# Patient Record
Sex: Female | Born: 1940 | Race: White | Hispanic: No | Marital: Married | Smoking: Former smoker
Health system: Southern US, Community
[De-identification: ages and names within clinical notes are randomized; demographics above are authoritative.]

## PROBLEM LIST (undated history)

## (undated) DIAGNOSIS — R112 Nausea with vomiting, unspecified: Secondary | ICD-10-CM

## (undated) DIAGNOSIS — K219 Gastro-esophageal reflux disease without esophagitis: Secondary | ICD-10-CM

## (undated) DIAGNOSIS — Z8709 Personal history of other diseases of the respiratory system: Secondary | ICD-10-CM

## (undated) DIAGNOSIS — I10 Essential (primary) hypertension: Secondary | ICD-10-CM

## (undated) DIAGNOSIS — Z9889 Other specified postprocedural states: Secondary | ICD-10-CM

## (undated) DIAGNOSIS — I700 Atherosclerosis of aorta: Secondary | ICD-10-CM

## (undated) DIAGNOSIS — S0300XA Dislocation of jaw, unspecified side, initial encounter: Secondary | ICD-10-CM

## (undated) DIAGNOSIS — Z8669 Personal history of other diseases of the nervous system and sense organs: Secondary | ICD-10-CM

## (undated) DIAGNOSIS — Z8719 Personal history of other diseases of the digestive system: Secondary | ICD-10-CM

## (undated) DIAGNOSIS — I774 Celiac artery compression syndrome: Secondary | ICD-10-CM

## (undated) DIAGNOSIS — Z889 Allergy status to unspecified drugs, medicaments and biological substances status: Secondary | ICD-10-CM

## (undated) DIAGNOSIS — E119 Type 2 diabetes mellitus without complications: Secondary | ICD-10-CM

## (undated) HISTORY — PX: APPENDECTOMY: SHX54

## (undated) HISTORY — PX: UPPER GI ENDOSCOPY: SHX6162

## (undated) HISTORY — PX: COLONOSCOPY: SHX174

## (undated) HISTORY — PX: TONSILLECTOMY: SUR1361

---

## 1984-06-16 HISTORY — PX: ABDOMINAL HYSTERECTOMY: SHX81

## 1998-11-06 ENCOUNTER — Ambulatory Visit (HOSPITAL_COMMUNITY): Admission: RE | Admit: 1998-11-06 | Discharge: 1998-11-06 | Payer: Self-pay | Admitting: *Deleted

## 2000-05-21 ENCOUNTER — Encounter (INDEPENDENT_AMBULATORY_CARE_PROVIDER_SITE_OTHER): Payer: Self-pay | Admitting: Specialist

## 2000-05-21 ENCOUNTER — Other Ambulatory Visit: Admission: RE | Admit: 2000-05-21 | Discharge: 2000-05-21 | Payer: Self-pay | Admitting: Otolaryngology

## 2002-01-17 ENCOUNTER — Ambulatory Visit (HOSPITAL_COMMUNITY): Admission: RE | Admit: 2002-01-17 | Discharge: 2002-01-17 | Payer: Self-pay | Admitting: Gastroenterology

## 2006-09-07 ENCOUNTER — Encounter: Admission: RE | Admit: 2006-09-07 | Discharge: 2006-12-06 | Payer: Self-pay | Admitting: Family Medicine

## 2007-06-17 HISTORY — PX: CHOLECYSTECTOMY: SHX55

## 2007-07-30 ENCOUNTER — Emergency Department (HOSPITAL_COMMUNITY): Admission: EM | Admit: 2007-07-30 | Discharge: 2007-07-30 | Payer: Self-pay | Admitting: Emergency Medicine

## 2007-11-04 ENCOUNTER — Ambulatory Visit (HOSPITAL_COMMUNITY): Admission: RE | Admit: 2007-11-04 | Discharge: 2007-11-05 | Payer: Self-pay | Admitting: General Surgery

## 2007-11-04 ENCOUNTER — Encounter (INDEPENDENT_AMBULATORY_CARE_PROVIDER_SITE_OTHER): Payer: Self-pay | Admitting: General Surgery

## 2010-10-29 NOTE — Op Note (Signed)
[name], [name]               ACCOUNT NO.:  [id number]   MEDICAL RECORD NO.:  [id number]          PATIENT TYPE:  OIB   LOCATION:  5118                         FACILITY:  [facility]   PHYSICIAN:  Cherylynn Ridges, M.D.    DATE OF BIRTH:  1940-06-26   DATE OF PROCEDURE:  DATE OF DISCHARGE:                               OPERATIVE REPORT   PREOPERATIVE DIAGNOSIS:  Symptomatic gallstones.   POSTOPERATIVE DIAGNOSIS:  Miliary gallstones, multiple small gallstones  with possible distal nonobstructing common bile duct stone.   PROCEDURE:  Laparoscopic cholecystectomy with cholangiogram.   SURGEON:  Cherylynn Ridges, MD   ASSISTANT:  Caleen Essex III, MD   ANESTHESIA:  General endotracheal.   ESTIMATED BLOOD LOSS:  Less than 50 mL.   COMPLICATIONS:  None.   CONDITION:  Stable.   FINDINGS:  Intraoperative cholangiogram demonstrated what appeared to be  a nonobstructing distal common bile duct stone.  The patient had  multiple small stones contained within the gallbladder, making it  slightly difficult to remove it from the fascia.   INDICATIONS FOR OPERATION:  The patient is a 70 year old with symptoms  of gallstones and an ultrasound demonstrating multiple numbers of  gallstones in the gallbladder.   OPERATION:  The patient was taken to the operating room, placed on table  in supine position.  After an adequate general endotracheal anesthetic  was administered, she was prepped and draped in usual sterile manner  exposing the periumbilical and right upper quadrant area.   A supraumbilical curvilinear incision was made using a #11 blade and  taken down to the midline fascia.  We grabbed the fascia with Kocher  clamp x2 and then made an incision between the Kocher clamps using a #15  blade.  This opened [place] up into the preperitoneal fat underneath the  fascia where the fascial edges were grabbed with Kocher clamps, we  tented up on as we bluntly dissected down into peritoneal cavity with  a  Kelly clamp.  Once we entered the peritoneal cavity, purse-string suture  of 0 Vicryl was passed around the fascial opening into the peritoneal  cavity.  This secured in a Hassan cannula which was passed into the  peritoneal cavity with minimal difficulty.  We insufflated carbon  dioxide gas into the peritoneal cavity once the Hassan cannula was then  secured in place.  We subsequently passed to right upper quadrant 5-mm  cannulas and a subxiphoid 12-mm cannula under direct vision.  Once all  cannulas were in place, the patient was placed in reverse Trendelenburg  position and the left-side was tilted down.   The gallbladder was tense, but was grabbed with a grasper and retracted  towards the anterior abdominal wall and right upper quadrant.  A second  grasper was passed onto the infundibulum.  This allowed [place] to dissect  out the peritoneum overlying the triangle of Calot and hepatoduodenal  triangle identifying both the cystic duct and cystic artery.  Two clips  were placed along the proximal distal side of the cystic artery and it  was transected.  The cystic duct  was clipped along the gallbladder side,  then a cholecystodochotomy made with laparoscopic scissors.  A Cook  catheter which had been passed through the anterior abdominal wall, was  passed into the cholecystodochotomy and we performed a cholangiogram  which showed flow into the duodenum, although somewhat delay with the  impression of a distal common bile duct nonobstructing stone.  There was  good proximal filling and no evidence of dilatation.   With these findings, the cholangiogram was completed, the clip was  removed, securing the cholangiogram of cholangiocatheter in place and  then the cystic duct clipped x3 and transected.  We then dissected out  the gallbladder from its bed although we did enter into the gallbladder  up near the dome, because of taking capsule, we had to use an EndoCatch  bag to remove it from  the supraumbilical site.  This trocar was also  opened up the gallbladder because of a large number of small stones  which were removed with a ring forceps.  We were able to remove the  gallbladder from the supraumbilical site without enlarging the fascia.  We reinserted the [facility] cannula, secured it in place, insufflated  abdomen, then inspected the gallbladder bed where there appeared to be a  vein within the bed itself which was bleeding somewhat and also  fluctuating with blood flow which we controlled with endoclip x2.  We  also packed it with Surgicel.  That appeared to control all bleeding.  There was no biliary stain and drainage.  We removed all cannulas and  aspirated fluid and gas from above the liver.   Once this was done, we closed the supraumbilical site using the purse-  string suture, which was then placed.  We then injected 0.25% Marcaine  with epinephrine at all sites.  We closed the supraumbilical fascia and  subxiphoid fascia using a running subcuticular stitch of 4-0 Vicryl.  Dermabond was then applied to all incisions including the lateral  cannula sites which were closed primarily with Dermabond, Steri-Strips,  and Tegaderm were then applied.  All counts were correct including  needles, sponges, and instruments.      Cherylynn Ridges, M.D.  Electronically Signed     JOW/MEDQ  D:  11/04/2007  T:  11/05/2007  Job:  [id number]   cc:   Caryn Bee L. Little, M.D.

## 2010-11-01 NOTE — Op Note (Signed)
   [name], [name]                    ACCOUNT NO.:  [id number]   MEDICAL RECORD NO.:  [id number]                   PATIENT TYPE:  AMB   LOCATION:  ENDO                                 FACILITY:  [facility]   PHYSICIAN:  James L. Malon Kindle., M.D.          DATE OF BIRTH:  November 25, 1940   DATE OF PROCEDURE:  01/17/2002  DATE OF DISCHARGE:                                 OPERATIVE REPORT   PROCEDURE:  Colonoscopy.   MEDICATIONS:  Fentanyl 100 mg, Versed 10 mg IV.   SCOPE:  Adult Olympus adult video colonoscope.   INDICATION:  Marked family history of chronic neoplasia with brother having  multiple polyps.   DESCRIPTION OF PROCEDURE:  The procedure had been explained to the patient  and consent obtained.  With the patient in the left lateral decubitus  position, the Olympus adult video colonoscope was used since no pediatric  scope was available, and the prep was excellent.  Using abdominal pressure  and multiple position changes, we were able to reach the cecum.  The  ileocecal valve and appendiceal orifice were seen.  The scope was withdrawn.  The cecum, ascending colon, hepatic flexure, transverse colon, splenic  flexure, descending, and sigmoid colon were seen well upon removal.  No  polyps seen.  The patient tolerated the procedure well, maintained on low-  flow oxygen and pulse oximeter throughout the procedure.   ASSESSMENT:  No evidence of colon polyps.   PLAN:  Yearly Hemoccults are recommended and repeating in five years.                                               James L. Malon Kindle., M.D.    Waldron Session  D:  01/17/2002  T:  01/21/2002  Job:  [id number]   cc:   Caryn Bee L. Little, M.D.

## 2011-03-07 LAB — COMPREHENSIVE METABOLIC PANEL WITH GFR
ALT: 46 — ABNORMAL HIGH
AST: 28
Albumin: 4.1
Alkaline Phosphatase: 101
BUN: 11
CO2: 24
Calcium: 9
Chloride: 109
Creatinine, Ser: 0.75
GFR calc non Af Amer: >60
Glucose, Bld: 149 — ABNORMAL HIGH
Potassium: 3.5
Sodium: 142
Total Bilirubin: 0.7
Total Protein: 6.4

## 2011-03-07 LAB — URINALYSIS, ROUTINE W REFLEX MICROSCOPIC
Bilirubin Urine: NEGATIVE
Glucose, UA: NEGATIVE
Hgb urine dipstick: NEGATIVE
Ketones, ur: NEGATIVE
Leukocytes, UA: NEGATIVE
Nitrite: POSITIVE — AB
Protein, ur: NEGATIVE
Specific Gravity, Urine: 1.025
Urobilinogen, UA: 1
pH: 5.5

## 2011-03-07 LAB — CBC
HCT: 36.1
Hemoglobin: 12.7
MCHC: 35.1
MCV: 91.2
Platelets: 111 — ABNORMAL LOW
RBC: 3.97
RDW: 13.4
WBC: 9.1

## 2011-03-07 LAB — LIPASE, BLOOD: Lipase: 28

## 2011-03-07 LAB — URINE CULTURE: Colony Count: >=100000

## 2011-03-07 LAB — DIFFERENTIAL
Basophils Absolute: 0.1
Eosinophils Relative: 1
Lymphocytes Relative: 13
Lymphs Abs: 1.2
Neutro Abs: 7.4
Neutrophils Relative %: 81 — ABNORMAL HIGH

## 2011-03-07 LAB — URINE MICROSCOPIC-ADD ON

## 2011-03-12 LAB — DIFFERENTIAL
Basophils Absolute: 0
Basophils Relative: 0
Eosinophils Relative: 3
Lymphocytes Relative: 32
Lymphocytes Relative: 33
Lymphs Abs: 2.1
Monocytes Absolute: 0.4
Monocytes Absolute: 0.4
Monocytes Relative: 6
Monocytes Relative: 7
Neutro Abs: 3.3
Neutro Abs: 4
Neutrophils Relative %: 59

## 2011-03-12 LAB — COMPREHENSIVE METABOLIC PANEL
AST: 28
Albumin: 3.5
Albumin: 4.3
Alkaline Phosphatase: 84
BUN: 7
Calcium: 9.1
Chloride: 104
Creatinine, Ser: 0.8
Creatinine, Ser: 0.81
GFR calc Af Amer: >60
Glucose, Bld: 125 — ABNORMAL HIGH
Total Bilirubin: 0.9
Total Protein: 5.6 — ABNORMAL LOW
Total Protein: 6.7

## 2011-03-12 LAB — CBC
HCT: 33.8 — ABNORMAL LOW
HCT: 37.4
Hemoglobin: 11.9 — ABNORMAL LOW
Hemoglobin: 12.9
MCV: 92.1
Platelets: 130 — ABNORMAL LOW
Platelets: 145 — ABNORMAL LOW
RBC: 4.1
RDW: 12.8

## 2011-03-17 ENCOUNTER — Ambulatory Visit: Payer: Medicare Other | Attending: Orthopedic Surgery | Admitting: Physical Therapy

## 2011-03-17 DIAGNOSIS — M25579 Pain in unspecified ankle and joints of unspecified foot: Secondary | ICD-10-CM | POA: Insufficient documentation

## 2011-03-17 DIAGNOSIS — M25673 Stiffness of unspecified ankle, not elsewhere classified: Secondary | ICD-10-CM | POA: Insufficient documentation

## 2011-03-17 DIAGNOSIS — IMO0001 Reserved for inherently not codable concepts without codable children: Secondary | ICD-10-CM | POA: Insufficient documentation

## 2011-03-17 DIAGNOSIS — M25676 Stiffness of unspecified foot, not elsewhere classified: Secondary | ICD-10-CM | POA: Insufficient documentation

## 2011-03-24 ENCOUNTER — Ambulatory Visit: Payer: Medicare Other

## 2011-03-31 ENCOUNTER — Ambulatory Visit: Payer: Medicare Other | Admitting: Physical Therapy

## 2011-04-02 ENCOUNTER — Encounter: Payer: Medicare Other | Admitting: Physical Therapy

## 2011-04-07 ENCOUNTER — Ambulatory Visit: Payer: Medicare Other | Admitting: Physical Therapy

## 2011-04-09 ENCOUNTER — Encounter: Payer: Medicare Other | Admitting: Physical Therapy

## 2011-04-14 ENCOUNTER — Encounter: Payer: Medicare Other | Admitting: Physical Therapy

## 2011-04-16 ENCOUNTER — Encounter: Payer: Medicare Other | Admitting: Physical Therapy

## 2011-04-21 ENCOUNTER — Other Ambulatory Visit: Payer: Self-pay | Admitting: Oral Surgery

## 2011-04-21 ENCOUNTER — Encounter: Payer: Medicare Other | Admitting: Physical Therapy

## 2011-04-21 DIAGNOSIS — R52 Pain, unspecified: Secondary | ICD-10-CM

## 2011-04-23 ENCOUNTER — Encounter: Payer: Medicare Other | Admitting: Physical Therapy

## 2011-04-25 ENCOUNTER — Ambulatory Visit
Admission: RE | Admit: 2011-04-25 | Discharge: 2011-04-25 | Disposition: A | Discharge status: Home / Self Care | Payer: Medicare Other | Source: Ambulatory Visit | Attending: Oral Surgery | Admitting: Oral Surgery

## 2011-04-25 DIAGNOSIS — R52 Pain, unspecified: Secondary | ICD-10-CM

## 2011-04-30 ENCOUNTER — Other Ambulatory Visit: Payer: Medicare Other

## 2011-06-17 HISTORY — PX: TRANSFER / TRANSPLANT ANKLE TENDON SUPERFICIAL / DEEP: SUR1365

## 2011-08-14 ENCOUNTER — Ambulatory Visit: Payer: Medicare Other | Attending: Orthopedic Surgery | Admitting: Physical Therapy

## 2011-08-14 DIAGNOSIS — R262 Difficulty in walking, not elsewhere classified: Secondary | ICD-10-CM | POA: Insufficient documentation

## 2011-08-14 DIAGNOSIS — IMO0001 Reserved for inherently not codable concepts without codable children: Secondary | ICD-10-CM | POA: Insufficient documentation

## 2011-08-14 DIAGNOSIS — M25673 Stiffness of unspecified ankle, not elsewhere classified: Secondary | ICD-10-CM | POA: Insufficient documentation

## 2011-08-14 DIAGNOSIS — M6281 Muscle weakness (generalized): Secondary | ICD-10-CM | POA: Insufficient documentation

## 2011-08-14 DIAGNOSIS — R5381 Other malaise: Secondary | ICD-10-CM | POA: Insufficient documentation

## 2011-08-14 DIAGNOSIS — R609 Edema, unspecified: Secondary | ICD-10-CM | POA: Insufficient documentation

## 2011-08-14 DIAGNOSIS — M25676 Stiffness of unspecified foot, not elsewhere classified: Secondary | ICD-10-CM | POA: Insufficient documentation

## 2011-08-15 ENCOUNTER — Ambulatory Visit: Payer: Medicare Other | Attending: Orthopedic Surgery | Admitting: Physical Therapy

## 2011-08-15 DIAGNOSIS — R609 Edema, unspecified: Secondary | ICD-10-CM | POA: Insufficient documentation

## 2011-08-15 DIAGNOSIS — IMO0001 Reserved for inherently not codable concepts without codable children: Secondary | ICD-10-CM | POA: Insufficient documentation

## 2011-08-15 DIAGNOSIS — M6281 Muscle weakness (generalized): Secondary | ICD-10-CM | POA: Insufficient documentation

## 2011-08-15 DIAGNOSIS — M25673 Stiffness of unspecified ankle, not elsewhere classified: Secondary | ICD-10-CM | POA: Insufficient documentation

## 2011-08-15 DIAGNOSIS — M25676 Stiffness of unspecified foot, not elsewhere classified: Secondary | ICD-10-CM | POA: Insufficient documentation

## 2011-08-15 DIAGNOSIS — R262 Difficulty in walking, not elsewhere classified: Secondary | ICD-10-CM | POA: Insufficient documentation

## 2011-08-15 DIAGNOSIS — R5381 Other malaise: Secondary | ICD-10-CM | POA: Insufficient documentation

## 2011-08-18 ENCOUNTER — Ambulatory Visit: Payer: Medicare Other | Admitting: Physical Therapy

## 2011-08-20 ENCOUNTER — Ambulatory Visit: Payer: Medicare Other | Admitting: Physical Therapy

## 2011-08-22 ENCOUNTER — Ambulatory Visit: Payer: Medicare Other | Admitting: Physical Therapy

## 2011-08-25 ENCOUNTER — Ambulatory Visit: Payer: Medicare Other | Admitting: Physical Therapy

## 2011-08-27 ENCOUNTER — Ambulatory Visit: Payer: Medicare Other

## 2011-09-01 ENCOUNTER — Ambulatory Visit: Payer: Medicare Other | Admitting: Physical Therapy

## 2011-09-02 ENCOUNTER — Encounter: Payer: Medicare Other | Admitting: Physical Therapy

## 2011-09-05 ENCOUNTER — Ambulatory Visit: Payer: Medicare Other | Admitting: Physical Therapy

## 2011-09-08 ENCOUNTER — Ambulatory Visit: Payer: Medicare Other

## 2011-09-09 ENCOUNTER — Encounter: Payer: Medicare Other | Admitting: Physical Therapy

## 2011-09-10 ENCOUNTER — Ambulatory Visit: Payer: Medicare Other | Admitting: Physical Therapy

## 2011-09-11 ENCOUNTER — Encounter: Payer: Medicare Other | Admitting: Physical Therapy

## 2011-09-15 ENCOUNTER — Ambulatory Visit: Payer: Medicare Other | Attending: Orthopedic Surgery

## 2011-09-15 DIAGNOSIS — M6281 Muscle weakness (generalized): Secondary | ICD-10-CM | POA: Insufficient documentation

## 2011-09-15 DIAGNOSIS — R609 Edema, unspecified: Secondary | ICD-10-CM | POA: Insufficient documentation

## 2011-09-15 DIAGNOSIS — R5381 Other malaise: Secondary | ICD-10-CM | POA: Insufficient documentation

## 2011-09-15 DIAGNOSIS — M25673 Stiffness of unspecified ankle, not elsewhere classified: Secondary | ICD-10-CM | POA: Insufficient documentation

## 2011-09-15 DIAGNOSIS — IMO0001 Reserved for inherently not codable concepts without codable children: Secondary | ICD-10-CM | POA: Insufficient documentation

## 2011-09-15 DIAGNOSIS — R262 Difficulty in walking, not elsewhere classified: Secondary | ICD-10-CM | POA: Insufficient documentation

## 2011-09-15 DIAGNOSIS — M25676 Stiffness of unspecified foot, not elsewhere classified: Secondary | ICD-10-CM | POA: Insufficient documentation

## 2011-09-16 ENCOUNTER — Encounter: Payer: Medicare Other | Admitting: Physical Therapy

## 2011-09-17 ENCOUNTER — Ambulatory Visit: Payer: Medicare Other | Admitting: Physical Therapy

## 2011-09-18 ENCOUNTER — Encounter: Payer: Medicare Other | Admitting: Physical Therapy

## 2011-09-19 ENCOUNTER — Encounter: Payer: Medicare Other | Admitting: Physical Therapy

## 2011-09-22 ENCOUNTER — Ambulatory Visit: Payer: Medicare Other | Admitting: Physical Therapy

## 2011-09-24 ENCOUNTER — Encounter: Payer: Medicare Other | Admitting: Physical Therapy

## 2011-09-26 ENCOUNTER — Encounter: Payer: Medicare Other | Admitting: Physical Therapy

## 2011-09-26 ENCOUNTER — Ambulatory Visit: Payer: Medicare Other | Admitting: Physical Therapy

## 2011-09-29 ENCOUNTER — Ambulatory Visit: Payer: Medicare Other | Admitting: Physical Therapy

## 2011-10-01 ENCOUNTER — Encounter: Payer: Medicare Other | Admitting: Physical Therapy

## 2011-10-03 ENCOUNTER — Ambulatory Visit: Payer: Medicare Other | Admitting: Physical Therapy

## 2011-10-13 ENCOUNTER — Encounter (HOSPITAL_COMMUNITY): Payer: Self-pay

## 2011-10-13 ENCOUNTER — Encounter: Payer: Medicare Other | Admitting: Physical Therapy

## 2011-10-15 ENCOUNTER — Encounter: Payer: Medicare Other | Admitting: Physical Therapy

## 2011-10-17 ENCOUNTER — Encounter: Payer: Medicare Other | Admitting: Physical Therapy

## 2011-10-20 ENCOUNTER — Encounter (HOSPITAL_COMMUNITY)
Admission: RE | Admit: 2011-10-20 | Discharge: 2011-10-20 | Disposition: A | Discharge status: Home / Self Care | Payer: Medicare Other | Source: Ambulatory Visit | Attending: Oral Surgery | Admitting: Oral Surgery

## 2011-10-20 ENCOUNTER — Encounter (HOSPITAL_COMMUNITY): Payer: Self-pay

## 2011-10-20 HISTORY — DX: Allergy status to unspecified drugs, medicaments and biological substances: Z88.9

## 2011-10-20 HISTORY — DX: Dislocation of jaw, unspecified side, initial encounter: S03.00XA

## 2011-10-20 HISTORY — DX: Gastro-esophageal reflux disease without esophagitis: K21.9

## 2011-10-20 HISTORY — DX: Other specified postprocedural states: Z98.890

## 2011-10-20 HISTORY — DX: Other specified postprocedural states: R11.2

## 2011-10-20 LAB — SURGICAL PCR SCREEN
MRSA, PCR: NEGATIVE
Staphylococcus aureus: NEGATIVE

## 2011-10-20 LAB — CBC
HCT: 37.2 % (ref 36.0–46.0)
Hemoglobin: 12.9 g/dL (ref 12.0–15.0)
MCHC: 34.7 g/dL (ref 30.0–36.0)
RBC: 4.19 MIL/uL (ref 3.87–5.11)

## 2011-10-20 NOTE — Pre-Procedure Instructions (Signed)
20 [name]  10/20/2011   Your procedure is scheduled on:  Monday, May 13  Report to [facility] at 0530 AM.  Call this number if you have problems the morning of surgery: [phone]   Remember:   Do not eat food:After Midnight.  May have clear liquids: up to 4 Hours before arrival.  Clear liquids include soda, tea, black coffee, apple or grape juice, broth.  Take these medicines the morning of surgery with A SIP OF WATER: *Omeprazole**   Do not wear jewelry, make-up or nail polish.  Do not wear lotions, powders, or perfumes. You may wear deodorant.  Do not shave 48 hours prior to surgery.  Do not bring valuables to the hospital.  Contacts, dentures or bridgework may not be worn into surgery.  Leave suitcase in the car. After surgery it may be brought to your room.  For patients admitted to the hospital, checkout time is 11:00 AM the day of discharge.   Patients discharged the day of surgery will not be allowed to drive home.  Name and phone number of your driver: *n/a**  Special Instructions: CHG Shower Use Special Wash: 1/2 bottle night before surgery and 1/2 bottle morning of surgery.   Please read over the following fact sheets that you were given: Pain Booklet, Coughing and Deep Breathing, MRSA Information, Surgical Site Infection Prevention and Care and Recovery After Surgery

## 2011-10-27 ENCOUNTER — Ambulatory Visit (HOSPITAL_COMMUNITY)
Admission: RE | Admit: 2011-10-27 | Discharge: 2011-10-27 | Disposition: A | Discharge status: Home / Self Care | Payer: Medicare Other | Source: Ambulatory Visit | Attending: Oral Surgery | Admitting: Oral Surgery

## 2011-10-27 ENCOUNTER — Encounter (HOSPITAL_COMMUNITY): Payer: Self-pay | Admitting: *Deleted

## 2011-10-27 ENCOUNTER — Encounter (HOSPITAL_COMMUNITY)
Admission: RE | Disposition: A | Discharge status: Home / Self Care | Payer: Self-pay | Source: Ambulatory Visit | Attending: Oral Surgery

## 2011-10-27 ENCOUNTER — Encounter (HOSPITAL_COMMUNITY): Payer: Self-pay | Admitting: Anesthesiology

## 2011-10-27 ENCOUNTER — Ambulatory Visit (HOSPITAL_COMMUNITY): Payer: Medicare Other | Admitting: Anesthesiology

## 2011-10-27 DIAGNOSIS — K219 Gastro-esophageal reflux disease without esophagitis: Secondary | ICD-10-CM | POA: Insufficient documentation

## 2011-10-27 DIAGNOSIS — Z01812 Encounter for preprocedural laboratory examination: Secondary | ICD-10-CM | POA: Insufficient documentation

## 2011-10-27 DIAGNOSIS — M2669 Other specified disorders of temporomandibular joint: Secondary | ICD-10-CM | POA: Insufficient documentation

## 2011-10-27 DIAGNOSIS — M26659 Arthropathy of unspecified temporomandibular joint: Secondary | ICD-10-CM

## 2011-10-27 HISTORY — PX: TMJ ARTHROPLASTY: SHX1066

## 2011-10-27 SURGERY — ARTHROPLASTY, TMJ
Anesthesia: General | Site: Face | Laterality: Right | Wound class: Clean

## 2011-10-27 MED ORDER — BACITRACIN ZINC 500 UNIT/GM EX OINT
TOPICAL_OINTMENT | CUTANEOUS | Status: DC | PRN
  Administered 2011-10-27: 1 via TOPICAL

## 2011-10-27 MED ORDER — MIDAZOLAM HCL 5 MG/5ML IJ SOLN
INTRAMUSCULAR | Status: DC | PRN
  Administered 2011-10-27: 1 mg via INTRAVENOUS

## 2011-10-27 MED ORDER — SODIUM CHLORIDE 0.9 % IR SOLN
Status: DC | PRN
  Administered 2011-10-27: 1

## 2011-10-27 MED ORDER — PHENYLEPHRINE HCL 10 MG/ML IJ SOLN
INTRAMUSCULAR | Status: DC | PRN
  Administered 2011-10-27 (×3): 80 ug via INTRAVENOUS
  Administered 2011-10-27: 40 ug via INTRAVENOUS
  Administered 2011-10-27: 80 ug via INTRAVENOUS

## 2011-10-27 MED ORDER — PROMETHAZINE HCL 25 MG/ML IJ SOLN: 6.2500 mg | INTRAMUSCULAR | Status: DC | PRN

## 2011-10-27 MED ORDER — LACTATED RINGERS IV SOLN: INTRAVENOUS | Status: DC

## 2011-10-27 MED ORDER — ONDANSETRON HCL 4 MG/2ML IJ SOLN
INTRAMUSCULAR | Status: DC | PRN
  Administered 2011-10-27: 4 mg via INTRAVENOUS

## 2011-10-27 MED ORDER — SUCCINYLCHOLINE CHLORIDE 20 MG/ML IJ SOLN
INTRAMUSCULAR | Status: DC | PRN
  Administered 2011-10-27: 40 mg via INTRAVENOUS
  Administered 2011-10-27: 120 mg via INTRAVENOUS

## 2011-10-27 MED ORDER — LIDOCAINE HCL (CARDIAC) 20 MG/ML IV SOLN
INTRAVENOUS | Status: DC | PRN
  Administered 2011-10-27: 100 mg via INTRAVENOUS

## 2011-10-27 MED ORDER — LIDOCAINE-EPINEPHRINE 2 %-1:100000 IJ SOLN
INTRAMUSCULAR | Status: DC | PRN
  Administered 2011-10-27: 10 mL

## 2011-10-27 MED ORDER — FENTANYL CITRATE 0.05 MG/ML IJ SOLN
INTRAMUSCULAR | Status: DC | PRN
  Administered 2011-10-27 (×2): 50 ug via INTRAVENOUS

## 2011-10-27 MED ORDER — MEPERIDINE HCL 25 MG/ML IJ SOLN: 6.2500 mg | INTRAMUSCULAR | Status: DC | PRN

## 2011-10-27 MED ORDER — OXYMETAZOLINE HCL 0.05 % NA SOLN
NASAL | Status: DC | PRN
  Administered 2011-10-27: 1 via NASAL

## 2011-10-27 MED ORDER — PROPOFOL 10 MG/ML IV EMUL
INTRAVENOUS | Status: DC | PRN
  Administered 2011-10-27: 200 mg via INTRAVENOUS

## 2011-10-27 MED ORDER — CLINDAMYCIN PHOSPHATE 600 MG/4ML IJ SOLN
INTRAMUSCULAR | Status: AC
Start: 2011-10-27 — End: 2011-10-27
  Filled 2011-10-27: qty 4

## 2011-10-27 MED ORDER — SCOPOLAMINE 1 MG/3DAYS TD PT72
MEDICATED_PATCH | TRANSDERMAL | Status: DC | PRN
  Administered 2011-10-27: 1 via TRANSDERMAL

## 2011-10-27 MED ORDER — ONDANSETRON HCL 4 MG PO TABS: 4.0000 mg | ORAL_TABLET | Freq: Three times a day (TID) | ORAL | Status: AC | PRN

## 2011-10-27 MED ORDER — CLINDAMYCIN PHOSPHATE 600 MG/50ML IV SOLN
INTRAVENOUS | Status: DC | PRN
  Administered 2011-10-27: 600 mg via INTRAVENOUS

## 2011-10-27 MED ORDER — OXYCODONE-ACETAMINOPHEN 5-325 MG PO TABS: 1.0000 | ORAL_TABLET | ORAL | Status: AC | PRN

## 2011-10-27 MED ORDER — HYDROMORPHONE HCL PF 1 MG/ML IJ SOLN
0.2500 mg | INTRAMUSCULAR | Status: DC | PRN
  Administered 2011-10-27: 0.5 mg via INTRAVENOUS

## 2011-10-27 MED ORDER — DEXTROSE 5 % IV SOLN
INTRAVENOUS | Status: AC
  Filled 2011-10-27: qty 50

## 2011-10-27 MED ORDER — LACTATED RINGERS IV SOLN
INTRAVENOUS | Status: DC | PRN
  Administered 2011-10-27 (×2): via INTRAVENOUS

## 2011-10-27 MED ORDER — DEXAMETHASONE SODIUM PHOSPHATE 10 MG/ML IJ SOLN
INTRAMUSCULAR | Status: DC | PRN
  Administered 2011-10-27: 8 mg via INTRAVENOUS

## 2011-10-27 SURGICAL SUPPLY — 66 items
APPLICATOR COTTON TIP 6IN STRL (MISCELLANEOUS) ×1 IMPLANT
ATTRACTOMAT 16X20 MAGNETIC DRP (DRAPES) ×2 IMPLANT
BANDAGE ELASTIC 4 VELCRO ST LF (GAUZE/BANDAGES/DRESSINGS) ×1 IMPLANT
BANDAGE GAUZE ELAST BULKY 4 IN (GAUZE/BANDAGES/DRESSINGS) ×1 IMPLANT
BLADE MINI 60D BLUE (BLADE) ×1 IMPLANT
BLADE RECIPRO TAPERED (BLADE) IMPLANT
BLADE SCLERAL 3.0 60 BEV DOWN (BLADE) IMPLANT
BLADE SURG 15 STRL LF DISP TIS (BLADE) IMPLANT
BLADE SURG 15 STRL SS (BLADE) ×2
BLADE SURG CLIPPER 3M 9600 (MISCELLANEOUS) ×2 IMPLANT
BUR CROSS CUT (BURR)
BUR SABER TAPERED DIAMOND 2 (BURR) ×1 IMPLANT
BUR SRG MED 1.6XXCUT FSSR (BURR) IMPLANT
BUR SURG 4X8 MED (BURR) IMPLANT
BURR SRG MED 1.6XXCUT FSSR (BURR)
BURR SURG 4X8 MED (BURR)
CANISTER SUCTION 2500CC (MISCELLANEOUS) ×2 IMPLANT
CLEANER TIP ELECTROSURG 2X2 (MISCELLANEOUS) ×2 IMPLANT
CLOTH BEACON ORANGE TIMEOUT ST (SAFETY) ×2 IMPLANT
COVER SURGICAL LIGHT HANDLE (MISCELLANEOUS) ×2 IMPLANT
CRADLE DONUT ADULT HEAD (MISCELLANEOUS) ×2 IMPLANT
DECANTER SPIKE VIAL GLASS SM (MISCELLANEOUS) ×1 IMPLANT
DRAPE SURG 17X23 STRL (DRAPES) ×2 IMPLANT
DRESSING TELFA 8X3 (GAUZE/BANDAGES/DRESSINGS) ×1 IMPLANT
ELECT COATED BLADE 2.86 ST (ELECTRODE) ×1 IMPLANT
ELECT NDL TIP 2.8 STRL (NEEDLE) ×1 IMPLANT
ELECT NEEDLE TIP 2.8 STRL (NEEDLE) ×2 IMPLANT
GAUZE SPONGE 4X4 16PLY XRAY LF (GAUZE/BANDAGES/DRESSINGS) ×1 IMPLANT
GLOVE BIO SURGEON STRL SZ 6.5 (GLOVE) ×2 IMPLANT
GLOVE BIO SURGEON STRL SZ7 (GLOVE) ×1 IMPLANT
GLOVE BIO SURGEON STRL SZ7.5 (GLOVE) ×2 IMPLANT
GLOVE BIOGEL PI IND STRL 7.0 (GLOVE) ×1 IMPLANT
GLOVE BIOGEL PI INDICATOR 7.0 (GLOVE) ×2
GLOVE SURG SS PI 6.5 STRL IVOR (GLOVE) ×1 IMPLANT
GOWN STRL NON-REIN LRG LVL3 (GOWN DISPOSABLE) ×4 IMPLANT
GOWN STRL REIN XL XLG (GOWN DISPOSABLE) ×2 IMPLANT
KIT BASIN OR (CUSTOM PROCEDURE TRAY) ×2 IMPLANT
KIT ROOM TURNOVER OR (KITS) ×2 IMPLANT
NDL BLUNT 16X1.5 OR ONLY (NEEDLE) IMPLANT
NEEDLE 22X1 1/2 (OR ONLY) (NEEDLE) ×2 IMPLANT
NEEDLE BLUNT 16X1.5 OR ONLY (NEEDLE) IMPLANT
NS IRRIG 1000ML POUR BTL (IV SOLUTION) ×2 IMPLANT
PAD ARMBOARD 7.5X6 YLW CONV (MISCELLANEOUS) ×4 IMPLANT
PENCIL BUTTON HOLSTER BLD 10FT (ELECTRODE) ×2 IMPLANT
RASP HELIOCORDIAL MED (MISCELLANEOUS) IMPLANT
SPECIMEN JAR SMALL (MISCELLANEOUS) ×1 IMPLANT
SPONGE GAUZE 4X4 12PLY (GAUZE/BANDAGES/DRESSINGS) ×1 IMPLANT
SUT CHROMIC 3 0 PS 2 (SUTURE) ×2 IMPLANT
SUT CHROMIC 4 0 P 3 18 (SUTURE) ×1 IMPLANT
SUT ETHILON 5 0 P 3 18 (SUTURE)
SUT MERSILENE 4 0 P 3 (SUTURE) ×2 IMPLANT
SUT NYLON ETHILON 5-0 P-3 1X18 (SUTURE) ×1 IMPLANT
SUT PROLENE 5 0 P 3 (SUTURE) ×2 IMPLANT
SUT SILK 3 0 (SUTURE)
SUT SILK 3-0 18XBRD TIE 12 (SUTURE) ×1 IMPLANT
SUT VIC AB 4-0 PC1 18 (SUTURE) ×1 IMPLANT
SYR 50ML SLIP (SYRINGE) IMPLANT
TAPE HY-TAPE 1X5Y PINK NS LF (GAUZE/BANDAGES/DRESSINGS) ×1 IMPLANT
TAPE SURG TRANSPORE 1 IN (GAUZE/BANDAGES/DRESSINGS) IMPLANT
TAPE SURGICAL TRANSPORE 1 IN (GAUZE/BANDAGES/DRESSINGS) ×1
TOWEL OR 17X24 6PK STRL BLUE (TOWEL DISPOSABLE) ×2 IMPLANT
TOWEL OR 17X26 10 PK STRL BLUE (TOWEL DISPOSABLE) ×2 IMPLANT
TRAY ENT MC OR (CUSTOM PROCEDURE TRAY) ×2 IMPLANT
TUBE CONNECTING 12X1/4 (SUCTIONS) ×2 IMPLANT
WATER STERILE IRR 1000ML POUR (IV SOLUTION) ×1 IMPLANT
YANKAUER SUCT BULB TIP NO VENT (SUCTIONS) ×2 IMPLANT

## 2011-10-27 NOTE — H&P (Signed)
HISTORY AND PHYSICAL  [name] is a 71 y.o. female patient with CC: Right TMJ pain, limited opening  No diagnosis found.  Past Medical History  Diagnosis Date  . GERD (gastroesophageal reflux disease)   . Multiple drug allergies   . TMJ (dislocation of temporomandibular joint)   . PONV (postoperative nausea and vomiting)     No current facility-administered medications for this encounter.   Facility-Administered Medications Ordered in Other Encounters  Medication Dose Route Frequency Provider Last Rate Last Dose  . scopolamine (TRANSDERM-SCOP) 1.5 MG    PRN Carmela Rima, CRNA   1 patch at 10/27/11 0701   Allergies  Allergen Reactions  . Cephalexin Other (See Comments)    headache  . Erythromycin Diarrhea and Other (See Comments)    Cold sweats  . Lactose Intolerance (Gi)   . Other Diarrhea    Viaxin- cold sweats  . Penicillins Itching and Other (See Comments)    Short of breath   Active Problems:  * No active hospital problems. *   Vitals: Blood pressure 146/66, pulse 64, temperature 97 F (36.1 C), temperature source Oral, resp. rate 18, SpO2 98.00%. Lab results:No results found for this or any previous visit (from the past 24 hour(s)). Radiology Results: No results found. General appearance: alert, cooperative, appears stated age and no distress Head: Normocephalic, without obvious abnormality, atraumatic Eyes: conjunctivae/corneas clear. PERRL, EOM's intact. Fundi benign. Ears: normal TM's and external ear canals both ears Nose: Nares normal. Septum midline. Mucosa normal. No drainage or sinus tenderness. Throat: lips, mucosa, and tongue normal; teeth and gums normal and limited mouth opening Neck: no adenopathy, no JVD and supple, symmetrical, trachea midline Resp: clear to auscultation bilaterally Cardio: regular rate and rhythm, S1, S2 normal, no murmur, click, rub or gallop Extremities: extremities normal, atraumatic, no cyanosis or  edema  Assessment: 71 WF GERD with right TMJ disc dislocation with possible perforation.  Plan: Right TMJ arthrotomy, possible disc plication or meniscectomy, possible arthroplasty. General anesthesia. Day surgery.   Georgia Lopes 10/27/2011

## 2011-10-27 NOTE — Transfer of Care (Signed)
Immediate Anesthesia Transfer of Care Note  Patient: [name]  Procedure(s) Performed: Procedure(s) (LRB): TEMPOROMANDIBULAR JOINT (TMJ) ARTHROPLASTY (Right)  Patient Location: PACU  Anesthesia Type: General  Level of Consciousness: awake, alert  and oriented  Airway & Oxygen Therapy: Patient Spontanous Breathing and Patient connected to face mask oxygen  Post-op Assessment: Report given to PACU RN and Post -op Vital signs reviewed and stable  Post vital signs: Reviewed and stable  Complications: No apparent anesthesia complications

## 2011-10-27 NOTE — Op Note (Signed)
NAMERAVEN, FURNAS               ACCOUNT NO.:  [id number]  MEDICAL RECORD NO.:  [id number]  LOCATION:  [facility]                         FACILITY:  [facility]  PHYSICIAN:  Georgia Lopes, M.D.  DATE OF BIRTH:  May 28, 1941  DATE OF PROCEDURE:  10/27/2011 DATE OF DISCHARGE:                              OPERATIVE REPORT   PREOPERATIVE DIAGNOSIS:  Right temporomandibular joint degenerative disk disease.  POSTOPERATIVE DIAGNOSES:  Right temporomandibular joint degenerative disk disease.  Anterior disk dislocation with perforation.  PROCEDURE:  Right TMJ arthroplasty, meniscectomy.  SURGEON:  Georgia Lopes, M.D.  ANESTHESIA:  General nasal.  INDICATIONS FOR PROCEDURE:  [name] is a 71 year old female, who was seen in my office at the request of her medical doctor for TMJ pain on the right side and limited opening, has been present for approximately 3 weeks.  She was evaluated and found to have a maximum opening of 27 mm, which was well below the normal range of 40-58mm.  MRI was obtained, which demonstrated bilateral dislocated menisci of the TMJ with perforation. Because of the patient's pain and limitation of motion with documented degenerative changes, it was recommended that the patient undergo a surgical procedure for correction of this deformity. Risks and complications were explained to the patient, including pain, swelling, infection, bleeding, numbness, and forehead muscle paralysis on the right side. The patient elected to proceed with surgery.  PROCEDURE:  The patient was taken to the operating room and placed on the table in supine position.  General anesthesia was administered intravenously and a nasal endotracheal tube was placed atraumatically. The tube was secured and the eyes were protected. The patient was then turned and the preauricular area was shaved and then the patient was prepped and draped for the procedure.  A time-out was performed.  Then the mouth was opened and  closed using the 10-10 drape, which had been placed in the sterile field.  Maximum opening was found to be 30 mm.  Then a sterile marking pen was used to demarcate a classical preauricular incision approach with superior-anterior curvature into the temporal region.  Then 2% lidocaine 1:100,000 epinephrine was infiltrated first into the joint space and then on the line of the incision. Then a #15 blade was used to make a full- thickness incision along the previously outlined path.  The dissection was carried down through skin and subcutaneous tissue.  Then in the temporal region, the dissection was carried out first, using a blunt hemostat, until the layer of deep temporal fascia was identified and then this was used as a plane of dissection throughout the entire length of the incision.  Then the dissection was further carried over the zygomatic arch using the superficial layer of deep temporal fascia as guide.  Then the tissue was incised overlying the zygomatic arch down to the bone. This was used as a level of dissection down to the area of the capsule. A mandibular clamp was placed on the right angle of the mandible to allow for inferior distraction and lateral displacement for aid in identification and dissection.  Then the capsule was sharply incised with a #15 blade and the hemostat was used to open into the  superior and inferior joint spaces. The disk was found to have a large perforation over its midportion and appeared to be laterally and anteriorly displaced. The condylar head had a cobblestone-appearance of the cartilage and had a large lateral lip osteophyte.  Dissection was carried out until the disk was completely freed up and then the disk was removed using the tenotomy scissors and Beaver blades and pituitary rongeur.  The remainder of the disk was removed in fragments. The disc was submitted to pathology.  Then the dissection was further carried out  To expose the condylar  head.  Using a bone file, the condyle was reshaped as was the   glenoid fossa.  Then the mouth was opened and closed, found to have good tracking.  The areas were irrigated and then the capsule was closed with 4-0 Mersilene and the deep layers were closed with 4-0 Vicryl and muscular layer was closed with 4-0 Vicryl as well and then the skin was closed with 5-0 Prolene.  Bacitracin was applied.  Sterile dressing was then placed with Kerlix, fluffs, and Ace wrap.  Then the mouth was opened and measured, and found to have maximum opening of 40 mm.  The patient was awakened, extubated, and taken to the recovery room breathing spontaneously in good condition.  ESTIMATED BLOOD LOSS:  Minimum.  COMPLICATIONS:  None.  SPECIMEN:  Right temporomandibular joint disk.     Georgia Lopes, M.D.     SMJ/MEDQ  D:  10/27/2011  T:  10/27/2011  Job:  [id number]

## 2011-10-27 NOTE — Op Note (Signed)
10/27/2011  9:18 AM  PATIENT:  [name]  71 y.o. female  PRE-OPERATIVE DIAGNOSIS:  Right Temporomandibular Joint Degenerative Joint Disease  POST-OPERATIVE DIAGNOSIS:  SAME, Right TMJ disc perforation with anterior dislocation  PROCEDURE:  Procedure(s): TEMPOROMANDIBULAR JOINT (TMJ) ARTHROPLASTY, MENISCECTOMY  SURGEON:  Surgeon(s): Georgia Lopes, DDS  ANESTHESIA:   local and general  EBL:  minimal  DRAINS: none   SPECIMEN:  No Specimen  COUNTS:  YES  PLAN OF CARE: Discharge to home after PACU  PATIENT DISPOSITION:  PACU - hemodynamically stable.   PROCEDURE DETAILS: Dictation # [id number]   Georgia Lopes, DMD 10/27/2011 9:18 AM

## 2011-10-27 NOTE — Anesthesia Postprocedure Evaluation (Signed)
  Anesthesia Post-op Note  Patient: [name]  Procedure(s) Performed: Procedure(s) (LRB): TEMPOROMANDIBULAR JOINT (TMJ) ARTHROPLASTY (Right)  Patient Location: PACU  Anesthesia Type: General  Level of Consciousness: awake and alert   Airway and Oxygen Therapy: Patient Spontanous Breathing  Post-op Pain: mild  Post-op Assessment: Post-op Vital signs reviewed, Patient's Cardiovascular Status Stable, Respiratory Function Stable, Patent Airway and No signs of Nausea or vomiting  Post-op Vital Signs: stable  Complications: No apparent anesthesia complications

## 2011-10-27 NOTE — Anesthesia Procedure Notes (Signed)
Procedure Name: Intubation Date/Time: 10/27/2011 7:52 AM Performed by: Carmela Rima Pre-anesthesia Checklist: Patient identified, Timeout performed, Emergency Drugs available, Suction available and Patient being monitored Patient Re-evaluated:Patient Re-evaluated prior to inductionOxygen Delivery Method: Circle system utilized Preoxygenation: Pre-oxygenation with 100% oxygen Intubation Type: Rapid sequence Ventilation: Mask ventilation without difficulty Grade View: Grade II Nasal Tubes: Nasal Rae, Left, Nasal prep performed and Magill forceps- large, utilized Tube size: 7.0 mm Airway Equipment and Method: Video-laryngoscopy (Anterior anatomy.  limited mouth opening) Placement Confirmation: ETT inserted through vocal cords under direct vision,  positive ETCO2 and breath sounds checked- equal and bilateral Tube secured with: Tape Dental Injury: Bloody posterior oropharynx  Difficulty Due To: Difficulty was anticipated, Difficult Airway- due to anterior larynx and Difficult Airway- due to limited oral opening Future Recommendations: Recommend- induction with short-acting agent, and alternative techniques readily available

## 2011-10-27 NOTE — Preoperative (Signed)
Beta Blockers   Reason not to administer Beta Blockers:Not Applicable 

## 2011-10-27 NOTE — Anesthesia Preprocedure Evaluation (Addendum)
Anesthesia Evaluation  Patient identified by MRN, date of birth, ID band Patient awake    Reviewed: Allergy & Precautions, H&P , NPO status , Patient's Chart, lab work & pertinent test results  History of Anesthesia Complications (+) PONV  Airway Mallampati: III TM Distance: >3 FB Neck ROM: full  Mouth opening: Limited Mouth Opening  Dental  (+) Dental Advidsory Given, Teeth Intact and Chipped   Pulmonary neg pulmonary ROS, former smoker         Cardiovascular negative cardio ROS      Neuro/Psych negative neurological ROS  negative psych ROS   GI/Hepatic negative GI ROS, Neg liver ROS, GERD-  Medicated,  Endo/Other  negative endocrine ROS  Renal/GU negative Renal ROS  negative genitourinary   Musculoskeletal negative musculoskeletal ROS (+)   Abdominal   Peds negative pediatric ROS (+)  Hematology negative hematology ROS (+)   Anesthesia Other Findings   Reproductive/Obstetrics negative OB ROS                          Anesthesia Physical Anesthesia Plan  ASA: II  Anesthesia Plan: General   Post-op Pain Management:    Induction: Intravenous  Airway Management Planned: Oral ETT  Additional Equipment:   Intra-op Plan:   Post-operative Plan: Extubation in OR  Informed Consent: I have reviewed the patients History and Physical, chart, labs and discussed the procedure including the risks, benefits and alternatives for the proposed anesthesia with the patient or authorized representative who has indicated his/her understanding and acceptance.   Dental Advisory Given  Plan Discussed with: CRNA  Anesthesia Plan Comments: (This is the operative H&P. Pt seen and examined immediately prior and no change in condition noted.,)      Anesthesia Quick Evaluation

## 2011-10-28 ENCOUNTER — Encounter (HOSPITAL_COMMUNITY): Payer: Self-pay | Admitting: Oral Surgery

## 2015-11-06 DIAGNOSIS — R69 Illness, unspecified: Secondary | ICD-10-CM | POA: Diagnosis not present

## 2015-11-08 DIAGNOSIS — I10 Essential (primary) hypertension: Secondary | ICD-10-CM | POA: Diagnosis not present

## 2015-11-08 DIAGNOSIS — Z0000 Encounter for general adult medical examination without abnormal findings: Secondary | ICD-10-CM | POA: Diagnosis not present

## 2015-11-08 DIAGNOSIS — K219 Gastro-esophageal reflux disease without esophagitis: Secondary | ICD-10-CM | POA: Diagnosis not present

## 2015-11-22 DIAGNOSIS — L821 Other seborrheic keratosis: Secondary | ICD-10-CM | POA: Diagnosis not present

## 2016-01-14 DIAGNOSIS — R197 Diarrhea, unspecified: Secondary | ICD-10-CM | POA: Diagnosis not present

## 2016-03-14 DIAGNOSIS — K219 Gastro-esophageal reflux disease without esophagitis: Secondary | ICD-10-CM | POA: Diagnosis not present

## 2016-03-14 DIAGNOSIS — E559 Vitamin D deficiency, unspecified: Secondary | ICD-10-CM | POA: Diagnosis not present

## 2016-03-14 DIAGNOSIS — R8290 Unspecified abnormal findings in urine: Secondary | ICD-10-CM | POA: Diagnosis not present

## 2016-03-14 DIAGNOSIS — Z79899 Other long term (current) drug therapy: Secondary | ICD-10-CM | POA: Diagnosis not present

## 2016-03-14 DIAGNOSIS — Z23 Encounter for immunization: Secondary | ICD-10-CM | POA: Diagnosis not present

## 2016-03-14 DIAGNOSIS — E782 Mixed hyperlipidemia: Secondary | ICD-10-CM | POA: Diagnosis not present

## 2016-03-14 DIAGNOSIS — E119 Type 2 diabetes mellitus without complications: Secondary | ICD-10-CM | POA: Diagnosis not present

## 2016-03-14 DIAGNOSIS — Z8371 Family history of colonic polyps: Secondary | ICD-10-CM | POA: Diagnosis not present

## 2016-03-14 DIAGNOSIS — M8580 Other specified disorders of bone density and structure, unspecified site: Secondary | ICD-10-CM | POA: Diagnosis not present

## 2016-03-14 DIAGNOSIS — Z0000 Encounter for general adult medical examination without abnormal findings: Secondary | ICD-10-CM | POA: Diagnosis not present

## 2016-04-02 DIAGNOSIS — H2513 Age-related nuclear cataract, bilateral: Secondary | ICD-10-CM | POA: Diagnosis not present

## 2016-05-12 DIAGNOSIS — Z1231 Encounter for screening mammogram for malignant neoplasm of breast: Secondary | ICD-10-CM | POA: Diagnosis not present

## 2016-07-10 DIAGNOSIS — M25572 Pain in left ankle and joints of left foot: Secondary | ICD-10-CM | POA: Diagnosis not present

## 2016-10-31 DIAGNOSIS — M76822 Posterior tibial tendinitis, left leg: Secondary | ICD-10-CM | POA: Diagnosis not present

## 2016-11-12 DIAGNOSIS — M76822 Posterior tibial tendinitis, left leg: Secondary | ICD-10-CM | POA: Diagnosis not present

## 2016-11-20 DIAGNOSIS — M76822 Posterior tibial tendinitis, left leg: Secondary | ICD-10-CM | POA: Diagnosis not present

## 2016-11-27 DIAGNOSIS — M76822 Posterior tibial tendinitis, left leg: Secondary | ICD-10-CM | POA: Diagnosis not present

## 2016-12-04 DIAGNOSIS — M76822 Posterior tibial tendinitis, left leg: Secondary | ICD-10-CM | POA: Diagnosis not present

## 2016-12-11 DIAGNOSIS — M76822 Posterior tibial tendinitis, left leg: Secondary | ICD-10-CM | POA: Diagnosis not present

## 2017-03-30 DIAGNOSIS — Z23 Encounter for immunization: Secondary | ICD-10-CM | POA: Diagnosis not present

## 2017-03-30 DIAGNOSIS — J040 Acute laryngitis: Secondary | ICD-10-CM | POA: Diagnosis not present

## 2017-04-22 DIAGNOSIS — Z8371 Family history of colonic polyps: Secondary | ICD-10-CM | POA: Diagnosis not present

## 2017-04-22 DIAGNOSIS — E119 Type 2 diabetes mellitus without complications: Secondary | ICD-10-CM | POA: Diagnosis not present

## 2017-04-22 DIAGNOSIS — K219 Gastro-esophageal reflux disease without esophagitis: Secondary | ICD-10-CM | POA: Diagnosis not present

## 2017-04-22 DIAGNOSIS — Z0000 Encounter for general adult medical examination without abnormal findings: Secondary | ICD-10-CM | POA: Diagnosis not present

## 2017-04-22 DIAGNOSIS — E663 Overweight: Secondary | ICD-10-CM | POA: Diagnosis not present

## 2017-04-22 DIAGNOSIS — R8290 Unspecified abnormal findings in urine: Secondary | ICD-10-CM | POA: Diagnosis not present

## 2017-04-22 DIAGNOSIS — Z79899 Other long term (current) drug therapy: Secondary | ICD-10-CM | POA: Diagnosis not present

## 2017-04-22 DIAGNOSIS — E559 Vitamin D deficiency, unspecified: Secondary | ICD-10-CM | POA: Diagnosis not present

## 2017-04-22 DIAGNOSIS — M8580 Other specified disorders of bone density and structure, unspecified site: Secondary | ICD-10-CM | POA: Diagnosis not present

## 2017-04-22 DIAGNOSIS — E1165 Type 2 diabetes mellitus with hyperglycemia: Secondary | ICD-10-CM | POA: Diagnosis not present

## 2017-04-22 DIAGNOSIS — E782 Mixed hyperlipidemia: Secondary | ICD-10-CM | POA: Diagnosis not present

## 2017-04-29 DIAGNOSIS — E119 Type 2 diabetes mellitus without complications: Secondary | ICD-10-CM | POA: Diagnosis not present

## 2017-05-06 DIAGNOSIS — R69 Illness, unspecified: Secondary | ICD-10-CM | POA: Diagnosis not present

## 2017-05-15 DIAGNOSIS — Z1231 Encounter for screening mammogram for malignant neoplasm of breast: Secondary | ICD-10-CM | POA: Diagnosis not present

## 2017-05-15 DIAGNOSIS — M8589 Other specified disorders of bone density and structure, multiple sites: Secondary | ICD-10-CM | POA: Diagnosis not present

## 2017-12-09 DIAGNOSIS — R69 Illness, unspecified: Secondary | ICD-10-CM | POA: Diagnosis not present

## 2017-12-23 DIAGNOSIS — R1013 Epigastric pain: Secondary | ICD-10-CM | POA: Diagnosis not present

## 2017-12-24 DIAGNOSIS — K317 Polyp of stomach and duodenum: Secondary | ICD-10-CM | POA: Diagnosis not present

## 2017-12-24 DIAGNOSIS — K210 Gastro-esophageal reflux disease with esophagitis: Secondary | ICD-10-CM | POA: Diagnosis not present

## 2017-12-24 DIAGNOSIS — R1013 Epigastric pain: Secondary | ICD-10-CM | POA: Diagnosis not present

## 2017-12-29 DIAGNOSIS — K317 Polyp of stomach and duodenum: Secondary | ICD-10-CM | POA: Diagnosis not present

## 2018-01-27 DIAGNOSIS — R079 Chest pain, unspecified: Secondary | ICD-10-CM | POA: Diagnosis not present

## 2018-01-27 DIAGNOSIS — K209 Esophagitis, unspecified: Secondary | ICD-10-CM | POA: Diagnosis not present

## 2018-02-03 ENCOUNTER — Ambulatory Visit
Admission: RE | Admit: 2018-02-03 | Discharge: 2018-02-03 | Disposition: A | Discharge status: Home / Self Care | Payer: Self-pay | Source: Ambulatory Visit | Attending: Gastroenterology | Admitting: Gastroenterology

## 2018-02-03 ENCOUNTER — Other Ambulatory Visit: Payer: Self-pay | Admitting: Gastroenterology

## 2018-02-03 DIAGNOSIS — I700 Atherosclerosis of aorta: Secondary | ICD-10-CM

## 2018-02-03 DIAGNOSIS — R0602 Shortness of breath: Secondary | ICD-10-CM | POA: Diagnosis not present

## 2018-02-03 DIAGNOSIS — R079 Chest pain, unspecified: Secondary | ICD-10-CM

## 2018-02-03 HISTORY — DX: Atherosclerosis of aorta: I70.0

## 2018-02-09 ENCOUNTER — Other Ambulatory Visit: Payer: Self-pay | Admitting: Gastroenterology

## 2018-02-09 DIAGNOSIS — R1013 Epigastric pain: Secondary | ICD-10-CM | POA: Diagnosis not present

## 2018-02-09 DIAGNOSIS — R079 Chest pain, unspecified: Secondary | ICD-10-CM | POA: Diagnosis not present

## 2018-02-09 DIAGNOSIS — K219 Gastro-esophageal reflux disease without esophagitis: Secondary | ICD-10-CM | POA: Diagnosis not present

## 2018-02-16 ENCOUNTER — Other Ambulatory Visit: Payer: Self-pay | Admitting: Gastroenterology

## 2018-02-16 ENCOUNTER — Ambulatory Visit
Admission: RE | Admit: 2018-02-16 | Discharge: 2018-02-16 | Disposition: A | Discharge status: Home / Self Care | Payer: Medicare HMO | Source: Ambulatory Visit | Attending: Gastroenterology | Admitting: Gastroenterology

## 2018-02-16 DIAGNOSIS — R079 Chest pain, unspecified: Secondary | ICD-10-CM

## 2018-02-16 DIAGNOSIS — K559 Vascular disorder of intestine, unspecified: Secondary | ICD-10-CM

## 2018-02-16 DIAGNOSIS — K219 Gastro-esophageal reflux disease without esophagitis: Secondary | ICD-10-CM

## 2018-02-18 ENCOUNTER — Other Ambulatory Visit: Payer: Self-pay | Admitting: Gastroenterology

## 2018-02-18 DIAGNOSIS — K209 Esophagitis, unspecified without bleeding: Secondary | ICD-10-CM

## 2018-02-18 DIAGNOSIS — R079 Chest pain, unspecified: Secondary | ICD-10-CM

## 2018-02-18 DIAGNOSIS — R1013 Epigastric pain: Secondary | ICD-10-CM

## 2018-02-25 ENCOUNTER — Ambulatory Visit
Admission: RE | Admit: 2018-02-25 | Discharge: 2018-02-25 | Disposition: A | Discharge status: Home / Self Care | Payer: Medicare HMO | Source: Ambulatory Visit | Attending: Gastroenterology | Admitting: Gastroenterology

## 2018-02-25 DIAGNOSIS — K209 Esophagitis, unspecified without bleeding: Secondary | ICD-10-CM

## 2018-02-25 DIAGNOSIS — R079 Chest pain, unspecified: Secondary | ICD-10-CM | POA: Diagnosis not present

## 2018-02-25 DIAGNOSIS — Z8719 Personal history of other diseases of the digestive system: Secondary | ICD-10-CM

## 2018-02-25 DIAGNOSIS — K449 Diaphragmatic hernia without obstruction or gangrene: Secondary | ICD-10-CM | POA: Diagnosis not present

## 2018-02-25 DIAGNOSIS — R1013 Epigastric pain: Secondary | ICD-10-CM

## 2018-02-25 HISTORY — DX: Personal history of other diseases of the digestive system: Z87.19

## 2018-02-25 MED ORDER — IOPAMIDOL (ISOVUE-370) INJECTION 76%
75.0000 mL | Freq: Once | INTRAVENOUS | Status: AC | PRN
  Administered 2018-02-25: 75 mL via INTRAVENOUS

## 2018-03-08 ENCOUNTER — Ambulatory Visit (INDEPENDENT_AMBULATORY_CARE_PROVIDER_SITE_OTHER): Payer: Medicare HMO | Admitting: Vascular Surgery

## 2018-03-08 ENCOUNTER — Encounter: Payer: Self-pay | Admitting: Vascular Surgery

## 2018-03-08 VITALS — BP 172/64 | HR 56 | Temp 96.9°F | Resp 18 | Ht 65.0 in | Wt 154.0 lb

## 2018-03-08 DIAGNOSIS — K559 Vascular disorder of intestine, unspecified: Secondary | ICD-10-CM | POA: Diagnosis not present

## 2018-03-08 DIAGNOSIS — I774 Celiac artery compression syndrome: Secondary | ICD-10-CM

## 2018-03-08 DIAGNOSIS — E119 Type 2 diabetes mellitus without complications: Secondary | ICD-10-CM | POA: Diagnosis not present

## 2018-03-08 DIAGNOSIS — K219 Gastro-esophageal reflux disease without esophagitis: Secondary | ICD-10-CM | POA: Diagnosis not present

## 2018-03-08 NOTE — Progress Notes (Signed)
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 female, who is seen today for evaluation of abdominal pain, weight loss.  Had an extensive work-up for this.  She reports that this is been progressively more severe and she is lost 15 pounds.  In her words she reports that she is afraid to eat because she knows that she will have abdominal pain.  She reports that this began shortly after eating and extends across her upper abdomen and she motions across the area in front of both breasts.  She never has this when she is not eating and she has no abdominal tenderness.  She has been treated for reflux with no improvement.  She does have a history of prior cholecystectomy.  She underwent endoscopy and recently is undergone a CT scan for further evaluation as well.  This showed minimal sclerotic change to her aorta.  She had wide patency of her superior mesenteric artery and also patency of her inferior mesenteric artery.  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  Is no history of peripheral disease and no history of heart disease  Past Medical History:  Diagnosis Date  . GERD (gastroesophageal reflux disease)   . Multiple drug allergies   . PONV (postoperative nausea and vomiting)   . TMJ (dislocation of temporomandibular joint)     Family History  Problem Relation Age of Onset  . Anesthesia problems Neg Hx     SOCIAL HISTORY: Social History   Socioeconomic History  . Marital status: Married    Spouse name: Not on file  . Number of children: Not on file  . Years of education: Not on file  . Highest education level: Not on file  Occupational History    . Not on file  Social Needs  . Financial resource strain: Not on file  . Food insecurity:    Worry: Not on file    Inability: Not on file  . Transportation needs:    Medical: Not on file    Non-medical: Not on file  Tobacco Use  . Smoking status: Former Smoker    Packs/day: 2.00    Years: 8.00    Pack years: 16.00    Types: Cigarettes  . Smokeless tobacco: Never Used  . Tobacco comment: quit smoking 1968  Substance and Sexual Activity  . Alcohol use: No  . Drug use: No  . Sexual activity: Yes    Birth control/protection: Surgical  Lifestyle  . Physical activity:    Days per week: Not on file    Minutes per session: Not on file  . Stress: Not on file  Relationships  . Social connections:    Talks on phone: Not on file    Gets together: Not on file    Attends religious service: Not on file    Active member of club or organization: Not on file    Attends meetings of clubs or organizations: Not on file    Relationship status: Not on file  . Intimate partner violence:    Fear of current or ex partner: Not on file    Emotionally abused: Not on file    Physically abused: Not on file    Forced sexual  activity: Not on file  Other Topics Concern  . Not on file  Social History Narrative  . Not on file    Allergies  Allergen Reactions  . Cephalexin Other (See Comments)    headache  . Erythromycin Diarrhea and Other (See Comments)    Cold sweats  . Lactose Intolerance (Gi)   . Other Diarrhea    Viaxin- cold sweats  . Penicillins Itching and Other (See Comments)    Short of breath    Current Outpatient Medications  Medication Sig Dispense Refill  . losartan-hydrochlorothiazide (HYZAAR) 100-12.5 MG tablet Take 1 tablet by mouth daily.    . Calcium Carbonate-Vitamin D (CALCIUM-VITAMIN D3 PO) Take 1 tablet by mouth daily.    Marland Kitchen omeprazole (PRILOSEC) 20 MG capsule Take 20 mg by mouth daily.     No current facility-administered medications for this visit.     REVIEW  OF SYSTEMS:  [X]  denotes positive finding, [ ]  denotes negative finding Cardiac  Comments:  Chest pain or chest pressure: x  only with eating  Shortness of breath upon exertion:    Short of breath when lying flat:    Irregular heart rhythm:        Vascular    Pain in calf, thigh, or hip brought on by ambulation:    Pain in feet at night that wakes you up from your sleep:     Blood clot in your veins:    Leg swelling:         Pulmonary    Oxygen at home:    Productive cough:     Wheezing:         Neurologic    Sudden weakness in arms or legs:     Sudden numbness in arms or legs:     Sudden onset of difficulty speaking or slurred speech:    Temporary loss of vision in one eye:     Problems with dizziness:         Gastrointestinal    Blood in stool:     Vomited blood:         Genitourinary    Burning when urinating:     Blood in urine:        Psychiatric    Major depression:         Hematologic    Bleeding problems:    Problems with blood clotting too easily:        Skin    Rashes or ulcers:        Constitutional    Fever or chills:      PHYSICAL EXAM: Vitals:   03/08/18 1419 03/08/18 1420  BP: (!) 163/55 (!) 172/64  Pulse: 62 (!) 56  Resp: 18   Temp: (!) 96.9 F (36.1 C) (!) 96.9 F (36.1 C)  TempSrc: Temporal Temporal  Weight: 154 lb (69.9 kg)   Height: 5\' 5"  (1.651 m)     GENERAL: The patient is a well-nourished female, in no acute distress. The vital signs are documented above. CARDIOVASCULAR: Carotid arteries without bruits bilaterally.  2+ radial, 2+ femoral and 2+ dorsalis pedis pulses bilaterally PULMONARY: There is good air exchange  ABDOMEN: Soft and non-tender.  No abdominal bruit noted  mUSCULOSKELETAL: There are no major deformities or cyanosis. NEUROLOGIC: No focal weakness or paresthesias are detected. SKIN: There are no ulcers or rashes noted. PSYCHIATRIC: The patient has a normal affect.  DATA:  View her CT and actually reviewed  the films with the patient and  her husband present.  She does have a high-grade stenosis the origin of her celiac artery from compression of the crus of her diaphragm  MEDICAL ISSUES: Had extensive discussion with the patient and her husband.  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  Explained the rich collateral flow of blood through the superior mesenteric and inferior mesenteric which are usually would prevent symptoms related to single vessel narrowing.  Also explained the unknown cause of median arcuate ligament syndrome but that most research now suggest this may be related to the celiac ganglion nerves around the celiac artery.  Explained the treatment would be division of the crus of the diaphragm.  Explained that this classically has been done with open surgery but that laparoscopic treatment may be an option.  I did explain that I would need to discuss this with the general surgeons to determine if they do this would be appropriate.  We will communicate this further with the patient following this.  Also explained that division of the crus the diaphragm alone may not relieve the compression of her celiac artery but that the patient's generally have response despite this.  Also explained that after the extrinsic compression was relieved, she could have endovascular stenting to relieve the stenosis if she continued to be symptomatic.  Will contact her after discussing this with general surgery   Larina Earthlyodd F. Fredric Slabach, MD [facility] Tel [phone] Pager [phone]

## 2018-03-17 DIAGNOSIS — I774 Celiac artery compression syndrome: Secondary | ICD-10-CM | POA: Diagnosis not present

## 2018-04-09 DIAGNOSIS — R69 Illness, unspecified: Secondary | ICD-10-CM | POA: Diagnosis not present

## 2018-04-20 ENCOUNTER — Ambulatory Visit: Payer: Self-pay | Admitting: General Surgery

## 2018-04-29 DIAGNOSIS — E782 Mixed hyperlipidemia: Secondary | ICD-10-CM | POA: Diagnosis not present

## 2018-04-29 DIAGNOSIS — E1169 Type 2 diabetes mellitus with other specified complication: Secondary | ICD-10-CM | POA: Diagnosis not present

## 2018-04-29 DIAGNOSIS — K210 Gastro-esophageal reflux disease with esophagitis: Secondary | ICD-10-CM | POA: Diagnosis not present

## 2018-04-29 DIAGNOSIS — K559 Vascular disorder of intestine, unspecified: Secondary | ICD-10-CM | POA: Diagnosis not present

## 2018-04-29 DIAGNOSIS — G720 Drug-induced myopathy: Secondary | ICD-10-CM | POA: Diagnosis not present

## 2018-04-29 DIAGNOSIS — Z79899 Other long term (current) drug therapy: Secondary | ICD-10-CM | POA: Diagnosis not present

## 2018-04-29 DIAGNOSIS — Z800 Family history of malignant neoplasm of digestive organs: Secondary | ICD-10-CM | POA: Diagnosis not present

## 2018-04-29 DIAGNOSIS — R8290 Unspecified abnormal findings in urine: Secondary | ICD-10-CM | POA: Diagnosis not present

## 2018-04-29 DIAGNOSIS — M8580 Other specified disorders of bone density and structure, unspecified site: Secondary | ICD-10-CM | POA: Diagnosis not present

## 2018-04-29 DIAGNOSIS — Z1389 Encounter for screening for other disorder: Secondary | ICD-10-CM | POA: Diagnosis not present

## 2018-04-29 DIAGNOSIS — Z0000 Encounter for general adult medical examination without abnormal findings: Secondary | ICD-10-CM | POA: Diagnosis not present

## 2018-05-04 DIAGNOSIS — E119 Type 2 diabetes mellitus without complications: Secondary | ICD-10-CM | POA: Diagnosis not present

## 2018-05-04 DIAGNOSIS — H2513 Age-related nuclear cataract, bilateral: Secondary | ICD-10-CM | POA: Diagnosis not present

## 2018-05-06 ENCOUNTER — Encounter (HOSPITAL_COMMUNITY): Payer: Self-pay

## 2018-05-06 NOTE — Patient Instructions (Signed)
Your procedure is scheduled on:  Wednesday, Dec. 4, 2019   Surgery Time: 8:30AM-11:00AM   Report to [facility] Main  Entrance    Report to admitting at 6:30 AM   Call this number if you have problems the morning of surgery [phone]   Do not eat food:After Midnight.   Only clear liquids after midnight until 5:30AM morning of surgery  CLEAR LIQUID DIET  Foods Allowed                                                                     Foods Excluded  Coffee and tea, regular and decaf                             liquids that you cannot  Plain Jell-O in any flavor                                             see through such as: Fruit ices (not with fruit pulp)                                     milk, soups, orange juice  Iced Popsicles                                    All solid food Carbonated beverages, regular and diet                                    Cranberry, grape and apple juices Sports drinks like Gatorade Lightly seasoned clear broth or consume(fat free) Sugar, honey syrup  Sample Menu Breakfast                                Lunch                                     Supper Cranberry juice                    Beef broth                            Chicken broth Jell-O                                     Grape juice                           Apple juice Coffee or tea  Jell-O                                      Popsicle                                                Coffee or tea                        Coffee or tea       Brush your teeth the morning of surgery.   Do NOT smoke after Midnight   Complete one Ensure drink the morning of surgery by 5:30 AM the day of surgery.   Take these medicines the morning of surgery with A SIP OF WATER: None                               You may not have any metal on your body including hair pins, jewelry, and body piercings             Do not wear make-up, lotions, powders,  perfumes/cologne, or deodorant             Do not wear nail polish.  Do not shave  48 hours prior to surgery.            Do not bring valuables to the hospital. [facility] IS NOT             RESPONSIBLE   FOR VALUABLES.   Contacts, dentures or bridgework may not be worn into surgery.   Leave suitcase in the car. After surgery it may be brought to your room.   Special Instructions: Bring a copy of your healthcare power of attorney and living will documents         the day of surgery if you haven't scanned them in before.              Please read over the following fact sheets you were given:  [facility] - Preparing for Surgery Before surgery, you can play an important role.  Because skin is not sterile, your skin needs to be as free of germs as possible.  You can reduce the number of germs on your skin by washing with CHG (chlorahexidine gluconate) soap before surgery.  CHG is an antiseptic cleaner which kills germs and bonds with the skin to continue killing germs even after washing. Please DO NOT use if you have an allergy to CHG or antibacterial soaps.  If your skin becomes reddened/irritated stop using the CHG and inform your nurse when you arrive at Short Stay. Do not shave (including legs and underarms) for at least 48 hours prior to the first CHG shower.  You may shave your face/neck.  Please follow these instructions carefully:  1.  Shower with CHG Soap the night before surgery and the  morning of surgery.  2.  If you choose to wash your hair, wash your hair first as usual with your normal  shampoo.  3.  After you shampoo, rinse your hair and body thoroughly to remove the shampoo.  4.  Use CHG as you would any other liquid soap.  You can apply chg directly to the skin and wash.  Gently with a scrungie or clean washcloth.  5.  Apply the CHG Soap to your body ONLY FROM THE NECK DOWN.   Do   not use on face/ open                           Wound or open sores.  Avoid contact with eyes, ears mouth and   genitals (private parts).                       Wash face,  Genitals (private parts) with your normal soap.             6.  Wash thoroughly, paying special attention to the area where your    surgery  will be performed.  7.  Thoroughly rinse your body with warm water from the neck down.  8.  DO NOT shower/wash with your normal soap after using and rinsing off the CHG Soap.                9.  Pat yourself dry with a clean towel.            10.  Wear clean pajamas.            11.  Place clean sheets on your bed the night of your first shower and do not  sleep with pets. Day of Surgery : Do not apply any lotions/deodorants the morning of surgery.  Please wear clean clothes to the hospital/surgery center.  FAILURE TO FOLLOW THESE INSTRUCTIONS MAY RESULT IN THE CANCELLATION OF YOUR SURGERY  PATIENT SIGNATURE_________________________________  NURSE SIGNATURE__________________________________  ________________________________________________________________________  WHAT IS A BLOOD TRANSFUSION? Blood Transfusion Information  A transfusion is the replacement of blood or some of its parts. Blood is made up of multiple cells which provide different functions.  Red blood cells carry oxygen and are used for blood loss replacement.  White blood cells fight against infection.  Platelets control bleeding.  Plasma helps clot blood.  Other blood products are available for specialized needs, such as hemophilia or other clotting disorders. BEFORE THE TRANSFUSION  Who gives blood for transfusions?   Healthy volunteers who are fully evaluated to make sure their blood is safe. This is blood bank blood. Transfusion therapy is the safest it has ever been in the practice of medicine. Before blood is taken from a donor, a complete history is taken to make sure that person has no history of diseases nor engages in risky social behavior (examples are intravenous drug  use or sexual activity with multiple partners). The donor's travel history is screened to minimize risk of transmitting infections, such as malaria. The donated blood is tested for signs of infectious diseases, such as HIV and hepatitis. The blood is then tested to be sure it is compatible with you in order to minimize the chance of a transfusion reaction. If you or a relative donates blood, this is often done in anticipation of surgery and is not appropriate for emergency situations. It takes many days to process the donated blood. RISKS AND COMPLICATIONS Although transfusion therapy is very safe and saves many lives, the main dangers of transfusion include:   Getting an infectious disease.  Developing a transfusion reaction. This is an allergic reaction to something in the blood you were given. Every precaution  is taken to prevent this. The decision to have a blood transfusion has been considered carefully by your caregiver before blood is given. Blood is not given unless the benefits outweigh the risks. AFTER THE TRANSFUSION  Right after receiving a blood transfusion, you will usually feel much better and more energetic. This is especially true if your red blood cells have gotten low (anemic). The transfusion raises the level of the red blood cells which carry oxygen, and this usually causes an energy increase.  The nurse administering the transfusion will monitor you carefully for complications. HOME CARE INSTRUCTIONS  No special instructions are needed after a transfusion. You may find your energy is better. Speak with your caregiver about any limitations on activity for underlying diseases you may have. SEEK MEDICAL CARE IF:   Your condition is not improving after your transfusion.  You develop redness or irritation at the intravenous (IV) site. SEEK IMMEDIATE MEDICAL CARE IF:  Any of the following symptoms occur over the next 12 hours:  Shaking chills.  You have a temperature by mouth  above 102 F (38.9 C), not controlled by medicine.  Chest, back, or muscle pain.  People around you feel you are not acting correctly or are confused.  Shortness of breath or difficulty breathing.  Dizziness and fainting.  You get a rash or develop hives.  You have a decrease in urine output.  Your urine turns a dark color or changes to pink, red, or brown. Any of the following symptoms occur over the next 10 days:  You have a temperature by mouth above 102 F (38.9 C), not controlled by medicine.  Shortness of breath.  Weakness after normal activity.  The white part of the eye turns yellow (jaundice).  You have a decrease in the amount of urine or are urinating less often.  Your urine turns a dark color or changes to pink, red, or brown. Document Released: 05/30/2000 Document Revised: 08/25/2011 Document Reviewed: 01/17/2008 [facility] Patient Information 2014 [place], [place].  _______________________________________________________________________

## 2018-05-10 ENCOUNTER — Encounter (HOSPITAL_COMMUNITY)
Admission: RE | Admit: 2018-05-10 | Discharge: 2018-05-10 | Disposition: A | Discharge status: Home / Self Care | Payer: Medicare HMO | Source: Ambulatory Visit | Attending: General Surgery | Admitting: General Surgery

## 2018-05-10 ENCOUNTER — Other Ambulatory Visit: Payer: Self-pay

## 2018-05-10 ENCOUNTER — Encounter (HOSPITAL_COMMUNITY): Payer: Self-pay

## 2018-05-10 DIAGNOSIS — Z01818 Encounter for other preprocedural examination: Secondary | ICD-10-CM | POA: Insufficient documentation

## 2018-05-10 DIAGNOSIS — R001 Bradycardia, unspecified: Secondary | ICD-10-CM | POA: Diagnosis not present

## 2018-05-10 DIAGNOSIS — I774 Celiac artery compression syndrome: Secondary | ICD-10-CM | POA: Insufficient documentation

## 2018-05-10 HISTORY — DX: Atherosclerosis of aorta: I70.0

## 2018-05-10 HISTORY — DX: Personal history of other diseases of the digestive system: Z87.19

## 2018-05-10 HISTORY — DX: Personal history of other diseases of the nervous system and sense organs: Z86.69

## 2018-05-10 HISTORY — DX: Essential (primary) hypertension: I10

## 2018-05-10 HISTORY — DX: Personal history of other diseases of the respiratory system: Z87.09

## 2018-05-10 HISTORY — DX: Celiac artery compression syndrome: I77.4

## 2018-05-10 HISTORY — DX: Type 2 diabetes mellitus without complications: E11.9

## 2018-05-10 LAB — CBC WITH DIFFERENTIAL/PLATELET
Abs Immature Granulocytes: 0.01 10*3/uL (ref 0.00–0.07)
BASOS ABS: 0 10*3/uL (ref 0.0–0.1)
BASOS PCT: 1 %
Eosinophils Absolute: 0.2 10*3/uL (ref 0.0–0.5)
Eosinophils Relative: 3 %
HEMATOCRIT: 41.2 % (ref 36.0–46.0)
HEMOGLOBIN: 13.1 g/dL (ref 12.0–15.0)
IMMATURE GRANULOCYTES: 0 %
Lymphocytes Relative: 32 %
Lymphs Abs: 1.6 10*3/uL (ref 0.7–4.0)
MCH: 30.3 pg (ref 26.0–34.0)
MCHC: 31.8 g/dL (ref 30.0–36.0)
MCV: 95.2 fL (ref 80.0–100.0)
Monocytes Absolute: 0.4 10*3/uL (ref 0.1–1.0)
Monocytes Relative: 8 %
NEUTROS ABS: 2.9 10*3/uL (ref 1.7–7.7)
NEUTROS PCT: 56 %
NRBC: 0 % (ref 0.0–0.2)
Platelets: 154 10*3/uL (ref 150–400)
RBC: 4.33 MIL/uL (ref 3.87–5.11)
RDW: 12.5 % (ref 11.5–15.5)
WBC: 5.2 10*3/uL (ref 4.0–10.5)

## 2018-05-10 LAB — BASIC METABOLIC PANEL
ANION GAP: 7 (ref 5–15)
BUN: 14 mg/dL (ref 8–23)
CALCIUM: 9.2 mg/dL (ref 8.9–10.3)
CO2: 29 mmol/L (ref 22–32)
Chloride: 106 mmol/L (ref 98–111)
Creatinine, Ser: 0.75 mg/dL (ref 0.44–1.00)
Glucose, Bld: 103 mg/dL — ABNORMAL HIGH (ref 70–99)
POTASSIUM: 4.5 mmol/L (ref 3.5–5.1)
Sodium: 142 mmol/L (ref 135–145)

## 2018-05-10 LAB — ABO/RH: ABO/RH(D): A NEG

## 2018-05-10 MED ORDER — ENSURE PRE-SURGERY PO LIQD: 296.0000 mL | Freq: Once | ORAL | Status: DC

## 2018-05-10 NOTE — Pre-Procedure Instructions (Signed)
Hgb A1C (6.5) 04/29/2018 in chart.

## 2018-05-18 MED ORDER — GENTAMICIN SULFATE 40 MG/ML IJ SOLN
5.0000 mg/kg | INTRAVENOUS | Status: AC
  Administered 2018-05-19: 340 mg via INTRAVENOUS
  Filled 2018-05-18: qty 8.5

## 2018-05-18 MED ORDER — BUPIVACAINE LIPOSOME 1.3 % IJ SUSP
20.0000 mL | Freq: Once | INTRAMUSCULAR | Status: DC
  Filled 2018-05-18: qty 20

## 2018-05-18 MED ORDER — CLINDAMYCIN PHOSPHATE 900 MG/50ML IV SOLN
900.0000 mg | INTRAVENOUS | Status: AC
  Administered 2018-05-19: 900 mg via INTRAVENOUS
  Filled 2018-05-18: qty 50

## 2018-05-19 ENCOUNTER — Encounter (HOSPITAL_COMMUNITY)
Admission: RE | Disposition: A | Discharge status: Home / Self Care | Payer: Self-pay | Source: Other Acute Inpatient Hospital | Attending: General Surgery

## 2018-05-19 ENCOUNTER — Inpatient Hospital Stay (HOSPITAL_COMMUNITY): Payer: Medicare HMO | Admitting: Anesthesiology

## 2018-05-19 ENCOUNTER — Other Ambulatory Visit: Payer: Self-pay

## 2018-05-19 ENCOUNTER — Encounter (HOSPITAL_COMMUNITY): Payer: Self-pay

## 2018-05-19 ENCOUNTER — Inpatient Hospital Stay (HOSPITAL_COMMUNITY)
Admission: RE | Admit: 2018-05-19 | Discharge: 2018-05-24 | DRG: 982 | Disposition: A | Discharge status: Home / Self Care | Payer: Medicare HMO | Source: Other Acute Inpatient Hospital | Attending: General Surgery | Admitting: General Surgery

## 2018-05-19 DIAGNOSIS — R109 Unspecified abdominal pain: Secondary | ICD-10-CM | POA: Diagnosis not present

## 2018-05-19 DIAGNOSIS — R140 Abdominal distension (gaseous): Secondary | ICD-10-CM | POA: Diagnosis not present

## 2018-05-19 DIAGNOSIS — I10 Essential (primary) hypertension: Secondary | ICD-10-CM | POA: Diagnosis not present

## 2018-05-19 DIAGNOSIS — I9752 Accidental puncture and laceration of a circulatory system organ or structure during other procedure: Secondary | ICD-10-CM | POA: Diagnosis not present

## 2018-05-19 DIAGNOSIS — E739 Lactose intolerance, unspecified: Secondary | ICD-10-CM | POA: Diagnosis not present

## 2018-05-19 DIAGNOSIS — R110 Nausea: Secondary | ICD-10-CM

## 2018-05-19 DIAGNOSIS — I700 Atherosclerosis of aorta: Secondary | ICD-10-CM | POA: Diagnosis not present

## 2018-05-19 DIAGNOSIS — Z880 Allergy status to penicillin: Secondary | ICD-10-CM

## 2018-05-19 DIAGNOSIS — Y92234 Operating room of hospital as the place of occurrence of the external cause: Secondary | ICD-10-CM | POA: Diagnosis not present

## 2018-05-19 DIAGNOSIS — Z91048 Other nonmedicinal substance allergy status: Secondary | ICD-10-CM

## 2018-05-19 DIAGNOSIS — Z87891 Personal history of nicotine dependence: Secondary | ICD-10-CM | POA: Diagnosis not present

## 2018-05-19 DIAGNOSIS — Z881 Allergy status to other antibiotic agents status: Secondary | ICD-10-CM | POA: Diagnosis not present

## 2018-05-19 DIAGNOSIS — E119 Type 2 diabetes mellitus without complications: Secondary | ICD-10-CM | POA: Diagnosis present

## 2018-05-19 DIAGNOSIS — Y838 Other surgical procedures as the cause of abnormal reaction of the patient, or of later complication, without mention of misadventure at the time of the procedure: Secondary | ICD-10-CM | POA: Diagnosis not present

## 2018-05-19 DIAGNOSIS — Z96698 Presence of other orthopedic joint implants: Secondary | ICD-10-CM | POA: Diagnosis not present

## 2018-05-19 DIAGNOSIS — I774 Celiac artery compression syndrome: Secondary | ICD-10-CM | POA: Diagnosis not present

## 2018-05-19 DIAGNOSIS — Z9049 Acquired absence of other specified parts of digestive tract: Secondary | ICD-10-CM

## 2018-05-19 DIAGNOSIS — Z8719 Personal history of other diseases of the digestive system: Secondary | ICD-10-CM

## 2018-05-19 DIAGNOSIS — Z79899 Other long term (current) drug therapy: Secondary | ICD-10-CM

## 2018-05-19 HISTORY — PX: LAPAROSCOPIC ABDOMINAL EXPLORATION: SHX6249

## 2018-05-19 LAB — CBC
HCT: 38.8 % (ref 36.0–46.0)
Hemoglobin: 12.5 g/dL (ref 12.0–15.0)
MCH: 31.5 pg (ref 26.0–34.0)
MCHC: 32.2 g/dL (ref 30.0–36.0)
MCV: 97.7 fL (ref 80.0–100.0)
PLATELETS: 137 10*3/uL — AB (ref 150–400)
RBC: 3.97 MIL/uL (ref 3.87–5.11)
RDW: 12.2 % (ref 11.5–15.5)
WBC: 12.9 10*3/uL — ABNORMAL HIGH (ref 4.0–10.5)
nRBC: 0 % (ref 0.0–0.2)

## 2018-05-19 LAB — PREPARE RBC (CROSSMATCH)

## 2018-05-19 LAB — CREATININE, SERUM
Creatinine, Ser: 0.78 mg/dL (ref 0.44–1.00)
GFR calc Af Amer: >60 mL/min (ref >60)
GFR calc non Af Amer: >60 mL/min (ref >60)

## 2018-05-19 LAB — GLUCOSE, CAPILLARY: Glucose-Capillary: 131 mg/dL — ABNORMAL HIGH (ref 70–99)

## 2018-05-19 SURGERY — EXPLORATION, ABDOMEN, LAPAROSCOPIC
Anesthesia: General | Site: Abdomen

## 2018-05-19 MED ORDER — EPHEDRINE SULFATE-NACL 50-0.9 MG/10ML-% IV SOSY
PREFILLED_SYRINGE | INTRAVENOUS | Status: DC | PRN
  Administered 2018-05-19 (×3): 5 mg via INTRAVENOUS

## 2018-05-19 MED ORDER — FENTANYL CITRATE (PF) 100 MCG/2ML IJ SOLN
INTRAMUSCULAR | Status: AC
  Filled 2018-05-19: qty 2

## 2018-05-19 MED ORDER — BUPIVACAINE HCL (PF) 0.25 % IJ SOLN
INTRAMUSCULAR | Status: DC | PRN
  Administered 2018-05-19: 30 mL

## 2018-05-19 MED ORDER — LIDOCAINE 20MG/ML (2%) 15 ML SYRINGE OPTIME
INTRAMUSCULAR | Status: DC | PRN
  Administered 2018-05-19: 1.5 mg/kg/h via INTRAVENOUS

## 2018-05-19 MED ORDER — CHLORHEXIDINE GLUCONATE CLOTH 2 % EX PADS: 6.0000 | MEDICATED_PAD | Freq: Once | CUTANEOUS | Status: DC

## 2018-05-19 MED ORDER — MORPHINE SULFATE (PF) 4 MG/ML IV SOLN
2.0000 mg | INTRAVENOUS | Status: DC | PRN
  Administered 2018-05-21 – 2018-05-23 (×9): 2 mg via INTRAVENOUS
  Filled 2018-05-19 (×9): qty 1

## 2018-05-19 MED ORDER — KETAMINE HCL 10 MG/ML IJ SOLN
INTRAMUSCULAR | Status: AC
  Filled 2018-05-19: qty 1

## 2018-05-19 MED ORDER — HYDRALAZINE HCL 20 MG/ML IJ SOLN
10.0000 mg | INTRAMUSCULAR | Status: DC | PRN
  Administered 2018-05-21: 10 mg via INTRAVENOUS
  Filled 2018-05-19: qty 1

## 2018-05-19 MED ORDER — LIDOCAINE HCL 2 % IJ SOLN
INTRAMUSCULAR | Status: AC
  Filled 2018-05-19: qty 20

## 2018-05-19 MED ORDER — LACTATED RINGERS IV SOLN: INTRAVENOUS | Status: DC

## 2018-05-19 MED ORDER — ONDANSETRON HCL 4 MG/2ML IJ SOLN
INTRAMUSCULAR | Status: AC
  Filled 2018-05-19: qty 2

## 2018-05-19 MED ORDER — ONDANSETRON HCL 4 MG/2ML IJ SOLN
4.0000 mg | Freq: Four times a day (QID) | INTRAMUSCULAR | Status: DC | PRN
  Administered 2018-05-20 – 2018-05-22 (×4): 4 mg via INTRAVENOUS
  Filled 2018-05-19 (×4): qty 2

## 2018-05-19 MED ORDER — ROCURONIUM BROMIDE 100 MG/10ML IV SOLN
INTRAVENOUS | Status: AC
  Filled 2018-05-19: qty 1

## 2018-05-19 MED ORDER — DIPHENHYDRAMINE HCL 50 MG/ML IJ SOLN
INTRAMUSCULAR | Status: DC | PRN
  Administered 2018-05-19: 12.5 mg via INTRAVENOUS

## 2018-05-19 MED ORDER — LOSARTAN POTASSIUM 50 MG PO TABS: 100.0000 mg | ORAL_TABLET | Freq: Every day | ORAL | Status: DC

## 2018-05-19 MED ORDER — ROCURONIUM BROMIDE 10 MG/ML (PF) SYRINGE
PREFILLED_SYRINGE | INTRAVENOUS | Status: DC | PRN
  Administered 2018-05-19: 10 mg via INTRAVENOUS
  Administered 2018-05-19: 50 mg via INTRAVENOUS

## 2018-05-19 MED ORDER — DEXAMETHASONE SODIUM PHOSPHATE 10 MG/ML IJ SOLN
INTRAMUSCULAR | Status: AC
  Filled 2018-05-19: qty 1

## 2018-05-19 MED ORDER — LACTATED RINGERS IV SOLN
INTRAVENOUS | Status: DC | PRN
  Administered 2018-05-19: 08:00:00 via INTRAVENOUS

## 2018-05-19 MED ORDER — PHENYLEPHRINE 40 MCG/ML (10ML) SYRINGE FOR IV PUSH (FOR BLOOD PRESSURE SUPPORT)
PREFILLED_SYRINGE | INTRAVENOUS | Status: AC
  Filled 2018-05-19: qty 10

## 2018-05-19 MED ORDER — PROPOFOL 10 MG/ML IV BOLUS
INTRAVENOUS | Status: DC | PRN
  Administered 2018-05-19: 100 mg via INTRAVENOUS

## 2018-05-19 MED ORDER — FENTANYL CITRATE (PF) 100 MCG/2ML IJ SOLN
25.0000 ug | INTRAMUSCULAR | Status: DC | PRN
  Administered 2018-05-19: 50 ug via INTRAVENOUS

## 2018-05-19 MED ORDER — BUPIVACAINE LIPOSOME 1.3 % IJ SUSP
INTRAMUSCULAR | Status: DC | PRN
  Administered 2018-05-19: 20 mL

## 2018-05-19 MED ORDER — DIPHENHYDRAMINE HCL 12.5 MG/5ML PO ELIX: 12.5000 mg | ORAL_SOLUTION | Freq: Four times a day (QID) | ORAL | Status: DC | PRN

## 2018-05-19 MED ORDER — DIPHENHYDRAMINE HCL 50 MG/ML IJ SOLN
INTRAMUSCULAR | Status: AC
  Filled 2018-05-19: qty 1

## 2018-05-19 MED ORDER — HEPARIN SODIUM (PORCINE) 5000 UNIT/ML IJ SOLN
5000.0000 [IU] | Freq: Once | INTRAMUSCULAR | Status: AC
  Administered 2018-05-19: 5000 [IU] via SUBCUTANEOUS
  Filled 2018-05-19: qty 1

## 2018-05-19 MED ORDER — FENTANYL CITRATE (PF) 100 MCG/2ML IJ SOLN
INTRAMUSCULAR | Status: DC | PRN
  Administered 2018-05-19 (×3): 50 ug via INTRAVENOUS

## 2018-05-19 MED ORDER — PROPOFOL 10 MG/ML IV BOLUS
INTRAVENOUS | Status: AC
  Filled 2018-05-19: qty 20

## 2018-05-19 MED ORDER — 0.9 % SODIUM CHLORIDE (POUR BTL) OPTIME
TOPICAL | Status: DC | PRN
  Administered 2018-05-19: 2000 mL

## 2018-05-19 MED ORDER — ONDANSETRON HCL 4 MG/2ML IJ SOLN
INTRAMUSCULAR | Status: DC | PRN
  Administered 2018-05-19: 4 mg via INTRAVENOUS

## 2018-05-19 MED ORDER — DEXTROSE-NACL 5-0.45 % IV SOLN
INTRAVENOUS | Status: DC
  Administered 2018-05-19 – 2018-05-22 (×5): via INTRAVENOUS

## 2018-05-19 MED ORDER — KETAMINE HCL 10 MG/ML IJ SOLN
INTRAMUSCULAR | Status: DC | PRN
  Administered 2018-05-19: 35 mg via INTRAVENOUS
  Administered 2018-05-19: 15 mg via INTRAVENOUS

## 2018-05-19 MED ORDER — EPHEDRINE 5 MG/ML INJ
INTRAVENOUS | Status: AC
  Filled 2018-05-19: qty 10

## 2018-05-19 MED ORDER — SCOPOLAMINE 1 MG/3DAYS TD PT72
1.0000 | MEDICATED_PATCH | TRANSDERMAL | Status: DC
  Administered 2018-05-19: 1.5 mg via TRANSDERMAL
  Filled 2018-05-19: qty 1

## 2018-05-19 MED ORDER — ONDANSETRON 4 MG PO TBDP: 4.0000 mg | ORAL_TABLET | Freq: Four times a day (QID) | ORAL | Status: DC | PRN

## 2018-05-19 MED ORDER — TRAMADOL HCL 50 MG PO TABS
50.0000 mg | ORAL_TABLET | Freq: Four times a day (QID) | ORAL | Status: DC | PRN
  Administered 2018-05-20 – 2018-05-21 (×3): 50 mg via ORAL
  Filled 2018-05-19 (×4): qty 1

## 2018-05-19 MED ORDER — ACETAMINOPHEN 500 MG PO TABS
1000.0000 mg | ORAL_TABLET | ORAL | Status: AC
  Administered 2018-05-19: 1000 mg via ORAL
  Filled 2018-05-19: qty 2

## 2018-05-19 MED ORDER — LACTATED RINGERS IR SOLN
Status: DC | PRN
  Administered 2018-05-19: 1000 mL

## 2018-05-19 MED ORDER — BUPIVACAINE HCL (PF) 0.25 % IJ SOLN
INTRAMUSCULAR | Status: AC
  Filled 2018-05-19: qty 30

## 2018-05-19 MED ORDER — METOCLOPRAMIDE HCL 5 MG/ML IJ SOLN: 10.0000 mg | Freq: Once | INTRAMUSCULAR | Status: DC | PRN

## 2018-05-19 MED ORDER — FENTANYL CITRATE (PF) 100 MCG/2ML IJ SOLN
25.0000 ug | INTRAMUSCULAR | Status: DC | PRN
  Administered 2018-05-19 (×3): 50 ug via INTRAVENOUS

## 2018-05-19 MED ORDER — SUGAMMADEX SODIUM 200 MG/2ML IV SOLN
INTRAVENOUS | Status: DC | PRN
  Administered 2018-05-19: 150 mg via INTRAVENOUS

## 2018-05-19 MED ORDER — HYDROCODONE-ACETAMINOPHEN 5-325 MG PO TABS
1.0000 | ORAL_TABLET | ORAL | Status: DC | PRN
  Administered 2018-05-19 – 2018-05-23 (×5): 1 via ORAL
  Administered 2018-05-23: 2 via ORAL
  Administered 2018-05-23: 1 via ORAL
  Filled 2018-05-19 (×3): qty 1
  Filled 2018-05-19 (×2): qty 2
  Filled 2018-05-19 (×3): qty 1

## 2018-05-19 MED ORDER — DIPHENHYDRAMINE HCL 50 MG/ML IJ SOLN: 12.5000 mg | Freq: Four times a day (QID) | INTRAMUSCULAR | Status: DC | PRN

## 2018-05-19 MED ORDER — DEXAMETHASONE SODIUM PHOSPHATE 10 MG/ML IJ SOLN
INTRAMUSCULAR | Status: DC | PRN
  Administered 2018-05-19: 8 mg via INTRAVENOUS

## 2018-05-19 MED ORDER — ENOXAPARIN SODIUM 40 MG/0.4ML ~~LOC~~ SOLN
40.0000 mg | SUBCUTANEOUS | Status: DC
  Administered 2018-05-20 – 2018-05-24 (×5): 40 mg via SUBCUTANEOUS
  Filled 2018-05-19 (×5): qty 0.4

## 2018-05-19 MED ORDER — LOSARTAN POTASSIUM 50 MG PO TABS
100.0000 mg | ORAL_TABLET | Freq: Every day | ORAL | Status: DC
  Administered 2018-05-20 – 2018-05-24 (×5): 100 mg via ORAL
  Filled 2018-05-19 (×6): qty 2

## 2018-05-19 SURGICAL SUPPLY — 79 items
APL SKNCLS STERI-STRIP NONHPOA (GAUZE/BANDAGES/DRESSINGS) ×1
APPLIER CLIP 5 13 M/L LIGAMAX5 (MISCELLANEOUS)
APPLIER CLIP ROT 10 11.4 M/L (STAPLE)
APR CLP MED LRG 11.4X10 (STAPLE)
APR CLP MED LRG 5 ANG JAW (MISCELLANEOUS)
BANDAGE ADH SHEER 1  50/CT (GAUZE/BANDAGES/DRESSINGS) ×1 IMPLANT
BENZOIN TINCTURE PRP APPL 2/3 (GAUZE/BANDAGES/DRESSINGS) ×1 IMPLANT
BLADE EXTENDED COATED 6.5IN (ELECTRODE) IMPLANT
CABLE HIGH FREQUENCY MONO STRZ (ELECTRODE) ×1 IMPLANT
CELLS DAT CNTRL 66122 CELL SVR (MISCELLANEOUS) IMPLANT
CHLORAPREP W/TINT 26ML (MISCELLANEOUS) ×2 IMPLANT
CLIP APPLIE 5 13 M/L LIGAMAX5 (MISCELLANEOUS) IMPLANT
CLIP APPLIE ROT 10 11.4 M/L (STAPLE) IMPLANT
COUNTER NEEDLE 20 DBL MAG RED (NEEDLE) ×2 IMPLANT
COVER MAYO STAND STRL (DRAPES) ×6 IMPLANT
COVER SURGICAL LIGHT HANDLE (MISCELLANEOUS) ×2 IMPLANT
COVER WAND RF STERILE (DRAPES) ×1 IMPLANT
DECANTER SPIKE VIAL GLASS SM (MISCELLANEOUS) ×2 IMPLANT
DRAIN CHANNEL 19F RND (DRAIN) ×1 IMPLANT
DRAPE LAPAROSCOPIC ABDOMINAL (DRAPES) ×1 IMPLANT
DRAPE UTILITY XL STRL (DRAPES) ×3 IMPLANT
DRSG OPSITE POSTOP 4X10 (GAUZE/BANDAGES/DRESSINGS) IMPLANT
DRSG OPSITE POSTOP 4X6 (GAUZE/BANDAGES/DRESSINGS) IMPLANT
DRSG OPSITE POSTOP 4X8 (GAUZE/BANDAGES/DRESSINGS) IMPLANT
ELECT REM PT RETURN 15FT ADLT (MISCELLANEOUS) ×2 IMPLANT
ENDOLOOP SUT PDS II  0 18 (SUTURE)
ENDOLOOP SUT PDS II 0 18 (SUTURE) IMPLANT
EVACUATOR SILICONE 100CC (DRAIN) ×1 IMPLANT
GAUZE SPONGE 4X4 12PLY STRL (GAUZE/BANDAGES/DRESSINGS) IMPLANT
GAUZE SPONGE 4X4 16PLY XRAY LF (GAUZE/BANDAGES/DRESSINGS) ×2 IMPLANT
GLOVE BIOGEL PI IND STRL 7.0 (GLOVE) ×2 IMPLANT
GLOVE BIOGEL PI INDICATOR 7.0 (GLOVE) ×2
GLOVE SURG SS PI 7.0 STRL IVOR (GLOVE) ×4 IMPLANT
GOWN STRL REUS W/TWL XL LVL3 (GOWN DISPOSABLE) ×8 IMPLANT
HANDLE SUCTION POOLE (INSTRUMENTS) IMPLANT
IRRIG SUCT STRYKERFLOW 2 WTIP (MISCELLANEOUS)
IRRIGATION SUCT STRKRFLW 2 WTP (MISCELLANEOUS) ×1 IMPLANT
PACK COLON (CUSTOM PROCEDURE TRAY) ×3 IMPLANT
PAD POSITIONING PINK XL (MISCELLANEOUS) ×1 IMPLANT
PROTECTOR NERVE ULNAR (MISCELLANEOUS) IMPLANT
RELOAD STAPLE 60 2.6 WHT THN (STAPLE) IMPLANT
RELOAD STAPLER WHITE 60MM (STAPLE) IMPLANT
RETRACTOR WND ALEXIS 18 MED (MISCELLANEOUS) IMPLANT
RTRCTR WOUND ALEXIS 18CM MED (MISCELLANEOUS)
SCISSORS LAP 5X35 DISP (ENDOMECHANICALS) ×2 IMPLANT
SEALER TISSUE G2 STRG ARTC 35C (ENDOMECHANICALS) ×1 IMPLANT
SET IRRIG TUBING LAPAROSCOPIC (IRRIGATION / IRRIGATOR) ×1 IMPLANT
SHEARS HARMONIC ACE PLUS 36CM (ENDOMECHANICALS) ×1 IMPLANT
SLEEVE XCEL OPT CAN 5 100 (ENDOMECHANICALS) ×5 IMPLANT
SPONGE DRAIN TRACH 4X4 STRL 2S (GAUZE/BANDAGES/DRESSINGS) ×1 IMPLANT
STAPLER ECHELON LONG 60 440 (INSTRUMENTS) IMPLANT
STAPLER RELOAD WHITE 60MM (STAPLE)
STAPLER VISISTAT 35W (STAPLE) ×2 IMPLANT
STRIP CLOSURE SKIN 1/2X4 (GAUZE/BANDAGES/DRESSINGS) ×1 IMPLANT
SUCTION POOLE HANDLE (INSTRUMENTS)
SUT ETHILON 2 0 PS N (SUTURE) ×1 IMPLANT
SUT PDS AB 0 CT1 36 (SUTURE) IMPLANT
SUT PROLENE 2 0 KS (SUTURE) IMPLANT
SUT PROLENE 4 0 RB 1 (SUTURE) ×2
SUT PROLENE 4-0 RB1 .5 CRCL 36 (SUTURE) IMPLANT
SUT SILK 2 0 (SUTURE) ×2
SUT SILK 2 0 SH CR/8 (SUTURE) ×2 IMPLANT
SUT SILK 2-0 18XBRD TIE 12 (SUTURE) ×1 IMPLANT
SUT SILK 3 0 (SUTURE) ×2
SUT SILK 3 0 SH CR/8 (SUTURE) ×2 IMPLANT
SUT SILK 3-0 18XBRD TIE 12 (SUTURE) ×1 IMPLANT
SUT VIC AB 2-0 SH 27 (SUTURE) ×2
SUT VIC AB 2-0 SH 27X BRD (SUTURE) ×1 IMPLANT
SUT VICRYL 0 ENDOLOOP (SUTURE) IMPLANT
SYS LAPSCP GELPORT 120MM (MISCELLANEOUS)
SYSTEM LAPSCP GELPORT 120MM (MISCELLANEOUS) IMPLANT
TAPE CLOTH 4X10 WHT NS (GAUZE/BANDAGES/DRESSINGS) IMPLANT
TOWEL OR 17X26 10 PK STRL BLUE (TOWEL DISPOSABLE) IMPLANT
TOWEL OR NON WOVEN STRL DISP B (DISPOSABLE) ×2 IMPLANT
TRAY FOLEY CATH 14FRSI W/METER (CATHETERS) ×1 IMPLANT
TRAY FOLEY MTR SLVR 16FR STAT (SET/KITS/TRAYS/PACK) IMPLANT
TROCAR BLADELESS OPT 5 100 (ENDOMECHANICALS) ×2 IMPLANT
TROCAR XCEL 12X100 BLDLESS (ENDOMECHANICALS) ×3 IMPLANT
TUBING INSUF HEATED (TUBING) ×2 IMPLANT

## 2018-05-19 NOTE — H&P (Signed)
[name] is an 77 y.o. female.  HPI: 77 yo female with chronic intermittent post prandial abdominal pain. She has tried multiple treated and was diagnosed with median arcuate ligament syndrome on CTA.  Past Medical History:  Diagnosis Date  . Diabetes mellitus without complication (HCC)    diet controlled  . GERD (gastroesophageal reflux disease)   . History of appendicitis    childhood  . History of gallstones   . History of gastric polyp   . History of gastritis   . History of hiatal hernia 02/25/2018   Small, noted on CT Angio Abd/pelvis  . History of migraine   . History of tonsillitis    childhood  . Hypertension   . 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 Multiple drug allergies   . PONV (postoperative nausea and vomiting)   . Thoracic aorta atherosclerosis (HCC) 02/03/2018   Noted on CXR  . TMJ (dislocation of temporomandibular joint)     Past Surgical History:  Procedure Laterality Date  . ABDOMINAL HYSTERECTOMY  1986  . APPENDECTOMY     age 89  . CHOLECYSTECTOMY  2009   lap chole  . COLONOSCOPY    . TMJ ARTHROPLASTY  10/27/2011   Procedure: TEMPOROMANDIBULAR JOINT (TMJ) ARTHROPLASTY;  Surgeon: Georgia LopesScott M Jensen, DDS;  Location: [facility] OR;  Service: Oral Surgery;  Laterality: Right;  Right Temporomandibular Joint Arthrotomy, Meniscectomy, Arthroplasty  . TONSILLECTOMY    . TRANSFER / TRANSPLANT ANKLE TENDON SUPERFICIAL / DEEP  2013   rt ankle  . UPPER GI ENDOSCOPY      Family History  Problem Relation Age of Onset  . Anesthesia problems Neg Hx     Social History:  reports that she has quit smoking. Her smoking use included cigarettes. She has a 16.00 pack-year smoking history. She has never used smokeless tobacco. She reports that she does not drink alcohol or use drugs.  Allergies:  Allergies  Allergen Reactions  . Other Anaphylaxis and Diarrhea    Feathers  Anaphylaxis  Biaxin- cold sweats Wool-rash   . Penicillins Shortness Of Breath, Itching and Other (See Comments)    Has patient had a PCN reaction causing immediate rash, facial/tongue/throat swelling, SOB or lightheadedness with hypotension: No Has patient had a PCN reaction causing severe rash involving mucus membranes or skin necrosis: No Has patient had a PCN reaction that required hospitalization: No Has patient had a PCN reaction occurring within the last 10 years: No If all of the above answers are "NO", then may proceed with Cephalosporin use.   . Cephalexin Other (See Comments)    headache  . Erythromycin Diarrhea and Other (See Comments)    Cold sweats  . Lactose Intolerance (Gi) Other (See Comments)    Unknown    Medications: I have reviewed the patient's current medications.  Results for orders placed or performed during the hospital encounter of 05/19/18 (from the past 48 hour(s))  Prepare RBC (crossmatch)     Status: None   Collection Time: 05/19/18  7:00 AM  Result Value Ref Range   Order Confirmation      ORDER PROCESSED BY BLOOD BANK Performed at [facility], [address] [place], [place] [address]     No results found.  Review of Systems  Constitutional: Negative for chills and fever.  HENT: Negative for hearing loss.   Eyes: Negative for blurred vision and double vision.  Respiratory: Negative for cough and hemoptysis.   Cardiovascular: Negative for chest pain and palpitations.  Gastrointestinal: Positive for abdominal pain. Negative for nausea and vomiting.  Genitourinary: Negative for dysuria and urgency.  Musculoskeletal: Negative for myalgias and neck pain.  Skin: Negative for itching and rash.  Neurological: Negative for dizziness, tingling and headaches.  Endo/Heme/Allergies: Does not bruise/bleed easily.  Psychiatric/Behavioral: Negative for depression and suicidal ideas.   Blood pressure (!) 159/57, pulse 64,  temperature 97.8 F (36.6 C), resp. rate 13, height 5\' 5"  (1.651 m), weight 68.9 kg, SpO2 99 %. Physical Exam  Vitals reviewed. Constitutional: She is oriented to person, place, and time. She appears well-developed and well-nourished.  HENT:  Head: Normocephalic and atraumatic.  Eyes: Pupils are equal, round, and reactive to light. Conjunctivae and EOM are normal.  Neck: Normal range of motion. Neck supple.  Cardiovascular: Normal rate and regular rhythm.  Respiratory: Effort normal and breath sounds normal.  GI: Soft. Bowel sounds are normal. She exhibits no distension. There is no tenderness.  Musculoskeletal: Normal range of motion.  Neurological: She is alert and oriented to person, place, and time.  Skin: Skin is warm and dry.  Psychiatric: She has a normal mood and affect. Her behavior is normal.   Assessment/Plan: 77 yo female with median arcuate ligament syndrome -lap arcuate ligament transection -admit to inpatient after procedure  De Blanch Kinsinger 05/19/2018, 8:12 AM

## 2018-05-19 NOTE — Transfer of Care (Signed)
Immediate Anesthesia Transfer of Care Note  Patient: Amanda KapurSally Mulhall  Procedure(s) Performed: Procedure(s): LAPAROSCOPIC MEDIAN ARCUATE LIGAMENT RELEASE ERAS PATHWAY (N/A)  Patient Location: PACU  Anesthesia Type:General  Level of Consciousness:  sedated, patient cooperative and responds to stimulation  Airway & Oxygen Therapy:Patient Spontanous Breathing and Patient connected to face mask oxgen  Post-op Assessment:  Report given to PACU RN and Post -op Vital signs reviewed and stable  Post vital signs:  Reviewed and stable  Last Vitals:  Vitals:   05/19/18 0647  BP: (!) 159/57  Pulse: 64  Resp: 13  Temp: 36.6 C  SpO2: 99%    Complications: No apparent anesthesia complications

## 2018-05-19 NOTE — Anesthesia Preprocedure Evaluation (Signed)
Anesthesia Evaluation  Patient identified by MRN, date of birth, ID band Patient awake    Reviewed: Allergy & Precautions, NPO status , Patient's Chart, lab work & pertinent test results  History of Anesthesia Complications (+) PONV  Airway Mallampati: II  TM Distance: >3 FB Neck ROM: Full    Dental no notable dental hx.    Pulmonary neg pulmonary ROS, former smoker,    Pulmonary exam normal breath sounds clear to auscultation       Cardiovascular hypertension, Pt. on medications Normal cardiovascular exam Rhythm:Regular Rate:Normal     Neuro/Psych negative neurological ROS  negative psych ROS   GI/Hepatic Neg liver ROS, hiatal hernia, GERD  ,  Endo/Other  diabetes  Renal/GU negative Renal ROS  negative genitourinary   Musculoskeletal negative musculoskeletal ROS (+)   Abdominal   Peds negative pediatric ROS (+)  Hematology negative hematology ROS (+)   Anesthesia Other Findings   Reproductive/Obstetrics negative OB ROS                             Anesthesia Physical Anesthesia Plan  ASA: II  Anesthesia Plan: General   Post-op Pain Management:    Induction: Intravenous  PONV Risk Score and Plan: 4 or greater and Ondansetron, Dexamethasone, Midazolam and Diphenhydramine  Airway Management Planned: Oral ETT  Additional Equipment:   Intra-op Plan:   Post-operative Plan: Extubation in OR  Informed Consent: I have reviewed the patients History and Physical, chart, labs and discussed the procedure including the risks, benefits and alternatives for the proposed anesthesia with the patient or authorized representative who has indicated his/her understanding and acceptance.   Dental advisory given  Plan Discussed with: CRNA  Anesthesia Plan Comments:         Anesthesia Quick Evaluation

## 2018-05-19 NOTE — Op Note (Addendum)
Preoperative diagnosis: median arcuate ligament syndrome  Postoperative diagnosis: same   Procedure: laparoscopic release of median arcuate ligament  Surgeon: Feliciana RossettiLuke Melesio Madara, M.D.  Asst: Estelle GrumblesSteve Gross, M.D.  Anesthesia: general  Indications for procedure: Amanda KapurSally Podesta is a 77 y.o. year old female with symptoms of postprandial abdominal pain. 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  Description of procedure: The patient was brought into the operative suite. Anesthesia was administered with General endotracheal anesthesia. WHO checklist was applied. The patient was then placed in supine position. The area was prepped and draped in the usual sterile fashion.  Next, a small transverse incision was made in the left subcostal area and in the a 5 mm trocar was used to gain access to the peritoneum.  Insufflation was applied with a high flow low pressure.  Laparoscope was reinserted.  Next 5 additional trochars were placed one 5 mm trocar in the upper left mid abdomen approximately 15 cm xiphoid, 5 mm trocar in the left lateral, 1 5 mm trocar in the right subcostal area 1 5 mm trocar in the right.  Exparel Marcaine mix was then infused into the left and right TAP space.  Next, dissection began by dividing the gastrocolic ligament with harmonic scalpel moving up through the short gastrics to completely mobilize the upper portion of the stomach.  Stomach was then retracted medially and Nathanson retractor was then placed through a subcostal incision to retract the left lower stomach.  Next, blunt dissection was used to visualize the left gastric gastric was for there is no scalpel.  We were then able to visualize left crus.  She was placed on the left right crus junction and began dissecting with harmonic scalpel.  All the way through the muscle and were able to the aorta adventitia posteriorly back to the base of the celiac.   Branches of the muscular vessels that were clipped with 5 mm by.  Continued dissection using blunt dissection with harmonic scalpel.  As was used a cautery to separate the muscle fibers.  Used cautery to separate muscle fibers and cause injury to the vessels.  On further examinations to be a superficial injury to the celiac.  A 4-0 Prolene was used to suture this injury closed with a figure-of-eight with good hemostasis.  There are small amount more blunt dissection appeared to visualize the celiac trunk into the aorta as well as seen in the splenic artery and left gastric celiac trunk without extrinsic compression.  Therefore we stopped dissect further.  The left gastric and splenic artery are examined and appeared to have based on pulsatile distention.  Because of the location and a small amount of manipulation of the pancreatic body as well as the high milk vessels in the area placed a 19 [place] Blake drain in the abdomen and brought it out the left lateral trocar site.  The drain was placed along dissection area and over the spleen.  The drain was stitched in place with a 2-0 nylon.  Stomach was put back in the neutral position.  Pneumoperitoneum was removed.  All incisions were closed with 4-0 Monocryl fashion.  Steri-Strips and bandage in place for dressing.  Findings: visualization of the base of the celiac trunk without extrinsic compression  Specimen: none  Implant: [id number] blake drain   Blood loss: 50ml  Local anesthesia: 50 ml Exparel:Marcaine Mix  Complications: need to suture the celiac artery  Gurney Maxin, M.D. General, Bariatric, & Minimally Invasive Surgery [facility]

## 2018-05-19 NOTE — Anesthesia Postprocedure Evaluation (Signed)
Anesthesia Post Note  Patient: [name]  Procedure(s) Performed: LAPAROSCOPIC MEDIAN ARCUATE LIGAMENT RELEASE ERAS PATHWAY (N/A Abdomen)     Patient location during evaluation: PACU Anesthesia Type: General Level of consciousness: awake and alert Pain management: pain level controlled Vital Signs Assessment: post-procedure vital signs reviewed and stable Respiratory status: spontaneous breathing, nonlabored ventilation, respiratory function stable and patient connected to nasal cannula oxygen Cardiovascular status: blood pressure returned to baseline and stable Postop Assessment: no apparent nausea or vomiting Anesthetic complications: no    Last Vitals:  Vitals:   05/19/18 1130 05/19/18 1145  BP: (!) 136/57 (!) 138/55  Pulse: 61 68  Resp: 16 16  Temp:  (!) 36.3 C  SpO2: 100% 100%    Last Pain:  Vitals:   05/19/18 1145  PainSc: 4                  Phillips Groutarignan, Jiyaan Steinhauser

## 2018-05-19 NOTE — Anesthesia Procedure Notes (Addendum)
Procedure Name: Intubation Date/Time: 05/19/2018 8:39 AM Performed by: Anne Fu, CRNA Pre-anesthesia Checklist: Patient identified, Emergency Drugs available, Suction available, Patient being monitored and Timeout performed Patient Re-evaluated:Patient Re-evaluated prior to induction Oxygen Delivery Method: Circle system utilized Preoxygenation: Pre-oxygenation with 100% oxygen Induction Type: IV induction Ventilation: Mask ventilation without difficulty Laryngoscope Size: Mac and 4 Grade View: Grade III Tube type: Oral Tube size: 7.5 mm Number of attempts: 3 Airway Equipment and Method: Bougie stylet Placement Confirmation: ETT inserted through vocal cords under direct vision,  positive ETCO2 and breath sounds checked- equal and bilateral Tube secured with: Tape Dental Injury: Teeth and Oropharynx as per pre-operative assessment  Comments: DL X 2 Grade III view with noted anterior larynx - MD Carignan X 1 with MAC 4 Grade III view over bougie noted bilat lung sounds post placement positive ETCO2.

## 2018-05-20 ENCOUNTER — Encounter (HOSPITAL_COMMUNITY): Payer: Self-pay | Admitting: General Surgery

## 2018-05-21 ENCOUNTER — Inpatient Hospital Stay (HOSPITAL_COMMUNITY): Payer: Medicare HMO

## 2018-05-21 NOTE — Progress Notes (Signed)
Pt having nausea last night and today, not hearing many bowel sounds. She tried eating but is saying the taste is not right. Encouraged her to keep walking today.

## 2018-05-21 NOTE — Progress Notes (Signed)
  Progress Note: General Surgery Service   Assessment/Plan: Active Problems:   Median arcuate ligament syndrome (HCC)  s/p Procedure(s): LAPAROSCOPIC MEDIAN ARCUATE LIGAMENT RELEASE ERAS PATHWAY 05/19/2018 Worse nausea and pain today -continue IV fluids -continue pain control -XR to evaluate for ileus    LOS: 2 days  Chief Complaint/Subjective: Pain worse today, nausea worse today not directly related to food  Objective: Vital signs in last 24 hours: Temp:  [98.1 F (36.7 C)-99.2 F (37.3 C)] 98.4 F (36.9 C) (12/06 1415) Pulse Rate:  [52-68] 68 (12/06 1415) Resp:  [16-20] 16 (12/06 1415) BP: (131-180)/(47-72) 157/72 (12/06 1415) SpO2:  [97 %-100 %] 97 % (12/06 1415) Last BM Date: 05/19/18  Intake/Output from previous day: 12/05 0701 - 12/06 0700 In: 2495 [P.O.:1345; I.V.:1150] Out: 3470 [Urine:3325; Drains:145] Intake/Output this shift: Total I/O In: 300 [P.O.:300] Out: 335 [Urine:300; Drains:35]  Lungs: nonlabored breathing  Cardiovascular: RRR  Abd: soft, ATTP, incisions c/d/i, drain with thin serosanguinous output  Extremities: no edema  Neuro: AOx4  Lab Results: CBC  Recent Labs    05/19/18 1352  WBC 12.9*  HGB 12.5  HCT 38.8  PLT 137*   BMET Recent Labs    05/19/18 1352  CREATININE 0.78   PT/INR No results for input(s): LABPROT, INR in the last 72 hours. ABG No results for input(s): PHART, HCO3 in the last 72 hours.  Invalid input(s): PCO2, PO2  Studies/Results:  Anti-infectives: Anti-infectives (From admission, onward)   Start     Dose/Rate Route Frequency Ordered Stop   05/19/18 0600  clindamycin (CLEOCIN) IVPB 900 mg     900 mg 100 mL/hr over 30 Minutes Intravenous On call to O.R. 05/18/18 [id number] 05/19/18 0840   05/19/18 0600  gentamicin (GARAMYCIN) 340 mg in dextrose 5 % 100 mL IVPB     5 mg/kg  68 kg 217 mL/hr over 30 Minutes Intravenous On call to O.R. 05/18/18 [id number] 05/19/18 0912      Medications: Scheduled Meds: .  enoxaparin (LOVENOX) injection  40 mg Subcutaneous Q24H  . losartan  100 mg Oral Daily   Continuous Infusions: . dextrose 5 % and 0.45% NaCl 50 mL/hr at 05/21/18 0600   PRN Meds:.diphenhydrAMINE **OR** diphenhydrAMINE, hydrALAZINE, HYDROcodone-acetaminophen, morphine injection, ondansetron **OR** ondansetron (ZOFRAN) IV, traMADol  Rodman PickleLuke Aaron Alayza Pieper, MD Office number [phone] [facility]

## 2018-05-22 MED ORDER — ALUM & MAG HYDROXIDE-SIMETH 200-200-20 MG/5ML PO SUSP
30.0000 mL | Freq: Four times a day (QID) | ORAL | Status: DC | PRN
  Administered 2018-05-22 – 2018-05-24 (×4): 30 mL via ORAL
  Filled 2018-05-22 (×4): qty 30

## 2018-05-22 NOTE — Progress Notes (Signed)
3 Days Post-Op   Subjective/Chief Complaint: Complains of abd pain. Vomited last night. abd xray showed no obstruction   Objective: Vital signs in last 24 hours: Temp:  [98.1 F (36.7 C)-98.8 F (37.1 C)] 98.2 F (36.8 C) (12/07 0940) Pulse Rate:  [65-96] 72 (12/07 0940) Resp:  [16-18] 18 (12/07 0436) BP: (148-184)/(47-78) 150/51 (12/07 0940) SpO2:  [95 %-99 %] 95 % (12/07 0940) Last BM Date: 05/19/18  Intake/Output from previous day: 12/06 0701 - 12/07 0700 In: 1376.5 [P.O.:670; I.V.:706.5] Out: 2055 [Urine:1950; Drains:105] Intake/Output this shift: No intake/output data recorded.  General appearance: alert and cooperative Resp: clear to auscultation bilaterally Cardio: regular rate and rhythm GI: soft, mild to mod tenderness  Lab Results:  Recent Labs    05/19/18 1352  WBC 12.9*  HGB 12.5  HCT 38.8  PLT 137*   BMET Recent Labs    05/19/18 1352  CREATININE 0.78   PT/INR No results for input(s): LABPROT, INR in the last 72 hours. ABG No results for input(s): PHART, HCO3 in the last 72 hours.  Invalid input(s): PCO2, PO2  Studies/Results: Dg Abd Portable 1v  Result Date: 05/21/2018 CLINICAL DATA:  One day post laparoscopic abdominal exploration. EXAM: PORTABLE ABDOMEN - 1 VIEW COMPARISON:  Abdominal CT 02/25/2018 FINDINGS: The bowel gas pattern is normal. Relative paucity of small bowel gas. No radio-opaque calculi or other significant radiographic abnormality are seen. IMPRESSION: Nonobstructive nonspecific bowel gas pattern with relative paucity of small bowel gas. Electronically Signed   By: Ted Mcalpineobrinka  Dimitrova M.D.   On: 05/21/2018 13:22    Anti-infectives: Anti-infectives (From admission, onward)   Start     Dose/Rate Route Frequency Ordered Stop   05/19/18 0600  clindamycin (CLEOCIN) IVPB 900 mg     900 mg 100 mL/hr over 30 Minutes Intravenous On call to O.R. 05/18/18 0853 05/19/18 0840   05/19/18 0600  gentamicin (GARAMYCIN) 340 mg in dextrose 5  % 100 mL IVPB     5 mg/kg  68 kg 217 mL/hr over 30 Minutes Intravenous On call to O.R. 05/18/18 [id number] 05/19/18 0912      Assessment/Plan: s/p Procedure(s): LAPAROSCOPIC MEDIAN ARCUATE LIGAMENT RELEASE ERAS PATHWAY (N/A) Advance diet. Will try protein and cal supplements Consider tpn if we can't give her orals soon ambulate  LOS: 3 days    Chevis Prettyaul Toth III 05/22/2018

## 2018-05-22 NOTE — Plan of Care (Signed)
Patient sitting up in chair this morning. A&O; c/o pain and requests pain medication at this time. Will continue to monitor.

## 2018-05-22 NOTE — Progress Notes (Signed)
Patient is alert and oriented, continues c/o pain on abd. On  Going need for pain management with little relief.

## 2018-05-23 LAB — TYPE AND SCREEN
ABO/RH(D): A NEG
ANTIBODY SCREEN: NEGATIVE
Unit division: 0
Unit division: 0

## 2018-05-23 LAB — BPAM RBC
Blood Product Expiration Date: 201912282359
Blood Product Expiration Date: 201912282359
Unit Type and Rh: 600
Unit Type and Rh: 600

## 2018-05-23 NOTE — Progress Notes (Signed)
4 Days Post-Op   Subjective/Chief Complaint: Feels a little better today. Complains of pain at drain site   Objective: Vital signs in last 24 hours: Temp:  [98.1 F (36.7 C)-99 F (37.2 C)] 98.4 F (36.9 C) (12/08 0959) Pulse Rate:  [71-86] 72 (12/08 0959) Resp:  [16-18] 17 (12/08 0959) BP: (141-161)/(49-59) 158/52 (12/08 0959) SpO2:  [97 %-100 %] 97 % (12/08 0959) Last BM Date: 05/19/18  Intake/Output from previous day: 12/07 0701 - 12/08 0700 In: 1770 [P.O.:570; I.V.:1200] Out: 2665 [Urine:2600; Drains:65] Intake/Output this shift: No intake/output data recorded.  General appearance: alert and cooperative Resp: clear to auscultation bilaterally Cardio: regular rate and rhythm GI: soft, mild tenderness. tolerated some food yesterday  Lab Results:  No results for input(s): WBC, HGB, HCT, PLT in the last 72 hours. BMET No results for input(s): NA, K, CL, CO2, GLUCOSE, BUN, CREATININE, CALCIUM in the last 72 hours. PT/INR No results for input(s): LABPROT, INR in the last 72 hours. ABG No results for input(s): PHART, HCO3 in the last 72 hours.  Invalid input(s): PCO2, PO2  Studies/Results: Dg Abd Portable 1v  Result Date: 05/21/2018 CLINICAL DATA:  One day post laparoscopic abdominal exploration. EXAM: PORTABLE ABDOMEN - 1 VIEW COMPARISON:  Abdominal CT 02/25/2018 FINDINGS: The bowel gas pattern is normal. Relative paucity of small bowel gas. No radio-opaque calculi or other significant radiographic abnormality are seen. IMPRESSION: Nonobstructive nonspecific bowel gas pattern with relative paucity of small bowel gas. Electronically Signed   By: Ted Mcalpineobrinka  Dimitrova M.D.   On: 05/21/2018 13:22    Anti-infectives: Anti-infectives (From admission, onward)   Start     Dose/Rate Route Frequency Ordered Stop   05/19/18 0600  clindamycin (CLEOCIN) IVPB 900 mg     900 mg 100 mL/hr over 30 Minutes Intravenous On call to O.R. 05/18/18 0853 05/19/18 0840   05/19/18 0600   gentamicin (GARAMYCIN) 340 mg in dextrose 5 % 100 mL IVPB     5 mg/kg  68 kg 217 mL/hr over 30 Minutes Intravenous On call to O.R. 05/18/18 0853 05/19/18 0912      Assessment/Plan: s/p Procedure(s): LAPAROSCOPIC MEDIAN ARCUATE LIGAMENT RELEASE ERAS PATHWAY (N/A) Advance diet as tolerated Ambulate Drain output serous  LOS: 4 days    [name] 05/23/2018

## 2018-05-23 NOTE — Progress Notes (Signed)
Initial Nutrition Assessment  INTERVENTION:   Patient's family to bring plant based protein shakes (we discussed Vega protein shakes) Will continue to monitor plan and pt's tolerance of intakes  NUTRITION DIAGNOSIS:   Inadequate oral intake related to poor appetite(abdominal pain) as evidenced by per patient/family report.  GOAL:   Patient will meet greater than or equal to 90% of their needs  MONITOR:   PO intake, Supplement acceptance, Labs, Weight trends, I & O's  REASON FOR ASSESSMENT:   Consult Diet education  ASSESSMENT:   77 y.o. year old female with symptoms of postprandial abdominal pain. 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  Patient in room with husband at bedside. RD noted that pt has Muscle Milk supplements at bedside. Pt reports she does not tolerate them as she is lactose intolerant with an allergy to milk products and whey protein. RD suggested pt try to drink plant based protein supplements. Unfortunately we do not have a plant based protein option on our hospital formulary. Pt's husband was more than willing to get Vega protein shakes for patient. Pt has tried these in the past.   Will continue to monitor for nutritional needs.  Per weight records, no weight loss noted.  Labs reviewed. Medications:  IV Zofran PRN  NUTRITION - FOCUSED PHYSICAL EXAM:  Nutrition focused physical exam shows no sign of depletion of muscle mass or body fat.  Diet Order:   Diet Order            Diet Heart Room service appropriate? Yes; Fluid consistency: Thin  Diet effective now              EDUCATION NEEDS:   Education needs have been addressed  Skin:  Skin Assessment: Reviewed RN Assessment  Last BM:  12/4  Height:   Ht Readings from Last 1 Encounters:  05/19/18 5\' 5"  (1.651 m)    Weight:   Wt Readings from Last 1 Encounters:  05/19/18 68.9 kg    Ideal Body Weight:   56.8 kg  BMI:  Body mass index is 25.29 kg/m.  Estimated Nutritional Needs:   Kcal:  1600-1800  Protein:  75-85g  Fluid:  1.8L/day  Tilda FrancoLindsey Nea Gittens, MS, RD, LDN [facility] Clinical Dietitian Pager: [phone] After Hours Pager: [phone]

## 2018-05-23 NOTE — Plan of Care (Signed)
Patient up in chair this morning; states she rested a bit more last night. Requesting pain medication at this time. Will continue to monitor.

## 2018-05-24 MED ORDER — POLYETHYLENE GLYCOL 3350 17 G PO PACK
17.0000 g | PACK | Freq: Two times a day (BID) | ORAL | Status: DC
  Administered 2018-05-24: 17 g via ORAL
  Filled 2018-05-24: qty 1

## 2018-05-24 MED ORDER — SIMETHICONE 80 MG PO CHEW
80.0000 mg | CHEWABLE_TABLET | Freq: Four times a day (QID) | ORAL | Status: DC | PRN
  Administered 2018-05-24: 80 mg via ORAL
  Filled 2018-05-24: qty 1

## 2018-05-24 MED ORDER — HYDROCODONE-ACETAMINOPHEN 5-325 MG PO TABS: 1.0000 | ORAL_TABLET | Freq: Four times a day (QID) | ORAL | 0 refills | Status: DC | PRN

## 2018-05-24 NOTE — Progress Notes (Signed)
Discharge instructions given to pt and all questions were answered. Pt walked down to the lobby and was picked up by her husband. 

## 2018-05-24 NOTE — Care Management Important Message (Signed)
Important Message  Patient Details  Name: Lilian KapurSally Wexler MRN: [id number] Date of Birth: 1940-09-17   Medicare Important Message Given:  Yes    Caren MacadamFuller, Ismerai Bin 05/24/2018, 12:08 PMImportant Message  Patient Details  Name: Lilian KapurSally Bouley MRN: [id number] Date of Birth: 1940-09-17   Medicare Important Message Given:  Yes    Caren MacadamFuller, Andrei Mccook 05/24/2018, 12:08 PM

## 2018-05-24 NOTE — Progress Notes (Signed)
  Progress Note: General Surgery Service   Assessment/Plan: Active Problems:   Median arcuate ligament syndrome (HCC)  s/p Procedure(s): LAPAROSCOPIC MEDIAN ARCUATE LIGAMENT RELEASE ERAS PATHWAY 05/19/2018 Tolerating diet well, complaints of gas pain -miralax and gas x today -remove drain -hopefully home soon   LOS: 5 days  Chief Complaint/Subjective: Tolerating diet well, complains of gas pain and bloating, no bowel movement since surgery  Objective: Vital signs in last 24 hours: Temp:  [98.1 F (36.7 C)-98.4 F (36.9 C)] 98.1 F (36.7 C) (12/09 0512) Pulse Rate:  [72-97] 97 (12/09 0512) Resp:  [16-18] 18 (12/09 0512) BP: (135-159)/(45-67) 159/67 (12/09 0512) SpO2:  [94 %-97 %] 94 % (12/09 0512) Last BM Date: 05/19/18  Intake/Output from previous day: 12/08 0701 - 12/09 0700 In: 2220 [P.O.:1320; I.V.:900] Out: 2885 [Urine:2800; Drains:85] Intake/Output this shift: No intake/output data recorded.  Lungs: nonlabored breathing  Cardiovascular: RRR  Abd: soft, tender around drain site, incisions c/d/i, some distension  Extremities: no edema  Neuro: AOx4  Lab Results: CBC  No results for input(s): WBC, HGB, HCT, PLT in the last 72 hours. BMET No results for input(s): NA, K, CL, CO2, GLUCOSE, BUN, CREATININE, CALCIUM in the last 72 hours. PT/INR No results for input(s): LABPROT, INR in the last 72 hours. ABG No results for input(s): PHART, HCO3 in the last 72 hours.  Invalid input(s): PCO2, PO2  Studies/Results:  Anti-infectives: Anti-infectives (From admission, onward)   Start     Dose/Rate Route Frequency Ordered Stop   05/19/18 0600  clindamycin (CLEOCIN) IVPB 900 mg     900 mg 100 mL/hr over 30 Minutes Intravenous On call to O.R. 05/18/18 [id number] 05/19/18 0840   05/19/18 0600  gentamicin (GARAMYCIN) 340 mg in dextrose 5 % 100 mL IVPB     5 mg/kg  68 kg 217 mL/hr over 30 Minutes Intravenous On call to O.R. 05/18/18 [id number] 05/19/18 0912       Medications: Scheduled Meds: . enoxaparin (LOVENOX) injection  40 mg Subcutaneous Q24H  . losartan  100 mg Oral Daily  . polyethylene glycol  17 g Oral BID   Continuous Infusions: PRN Meds:.alum & mag hydroxide-simeth, diphenhydrAMINE **OR** diphenhydrAMINE, hydrALAZINE, HYDROcodone-acetaminophen, morphine injection, ondansetron **OR** ondansetron (ZOFRAN) IV, simethicone, traMADol  Rodman PickleLuke Aaron Kinsinger, MD [facility]

## 2018-05-24 NOTE — Discharge Summary (Signed)
Physician Discharge Summary  Amanda KapurSally Medina EAV:[id number] DOB: 07/28/1940 DOA: 05/19/2018  PCP: Catha GosselinLittle, Kevin, MD  Admit date: 05/19/2018 Discharge date: 05/24/2018  Recommendations for Outpatient Follow-up:  1.  (include homehealth, outpatient follow-up instructions, specific recommendations for PCP to follow-up on, etc.)   Discharge Diagnoses:  Active Problems:   Median arcuate ligament syndrome (HCC)   Surgical Procedure: lap median arcuate ligament syndrome  Discharge Condition: Good Disposition: Home  Diet recommendation: heart healthy diet   Hospital Course:  77 yo female presented to pre surgery area, underwent lap median arcuate ligament release. Post op she was admitted to the surgical floor. She initially had a moderate amount of pain and some nausea. She slowly improved over 4 days. Had some bloating treated with miralax and gas-x with good response and was discharged home.  Discharge Instructions  Discharge Instructions    Call MD for:  difficulty breathing, headache or visual disturbances   Complete by:  As directed    Call MD for:  persistant nausea and vomiting   Complete by:  As directed    Call MD for:  redness, tenderness, or signs of infection (pain, swelling, redness, odor or green/yellow discharge around incision site)   Complete by:  As directed    Call MD for:  severe uncontrolled pain   Complete by:  As directed    Call MD for:  temperature >100.4   Complete by:  As directed    Diet - low sodium heart healthy   Complete by:  As directed    Discharge wound care:   Complete by:  As directed    Remove bandaids tomorrow. Ok to shower tomorrow  Steristrips will likely peel off in 1-3 weeks   Increase activity slowly   Complete by:  As directed    Lifting restrictions   Complete by:  As directed    Do not lift more than 20 pounds for 3-4 weeks     Allergies as of 05/24/2018      Reactions   Other Anaphylaxis, Diarrhea   Feathers  Anaphylaxis Biaxin- cold sweats Wool-rash   Penicillins Shortness Of Breath, Itching, Other (See Comments)   Has patient had a PCN reaction causing immediate rash, facial/tongue/throat swelling, SOB or lightheadedness with hypotension: No Has patient had a PCN reaction causing severe rash involving mucus membranes or skin necrosis: No Has patient had a PCN reaction that required hospitalization: No Has patient had a PCN reaction occurring within the last 10 years: No If all of the above answers are "NO", then may proceed with Cephalosporin use.   Cephalexin Other (See Comments)   headache   Erythromycin Diarrhea, Other (See Comments)   Cold sweats   Lactose Intolerance (gi) Other (See Comments)   Unknown      Medication List    TAKE these medications   HYDROcodone-acetaminophen 5-325 MG tablet Commonly known as:  NORCO/VICODIN Take 1 tablet by mouth every 6 (six) hours as needed for moderate pain.   losartan 100 MG tablet Commonly known as:  COZAAR Take 100 mg by mouth daily.            Discharge Care Instructions  (From admission, onward)         Start     Ordered   05/24/18 0000  Discharge wound care:    Comments:  Remove bandaids tomorrow. Ok to shower tomorrow  Steristrips will likely peel off in 1-3 weeks   05/24/18 1510  The results of significant diagnostics from this hospitalization (including imaging, microbiology, ancillary and laboratory) are listed below for reference.    Significant Diagnostic Studies: Dg Abd Portable 1v  Result Date: 05/21/2018 CLINICAL DATA:  One day post laparoscopic abdominal exploration. EXAM: PORTABLE ABDOMEN - 1 VIEW COMPARISON:  Abdominal CT 02/25/2018 FINDINGS: The bowel gas pattern is normal. Relative paucity of small bowel gas. No radio-opaque calculi or other significant radiographic abnormality are seen. IMPRESSION: Nonobstructive nonspecific bowel gas pattern with relative paucity of small bowel gas.  Electronically Signed   By: Ted Mcalpine M.D.   On: 05/21/2018 13:22    Labs: Basic Metabolic Panel: Recent Labs  Lab 05/19/18 1352  CREATININE 0.78   Liver Function Tests: No results for input(s): AST, ALT, ALKPHOS, BILITOT, PROT, ALBUMIN in the last 168 hours.  CBC: Recent Labs  Lab 05/19/18 1352  WBC 12.9*  HGB 12.5  HCT 38.8  MCV 97.7  PLT 137*    CBG: Recent Labs  Lab 05/19/18 1101  GLUCAP 131*    Active Problems:   Median arcuate ligament syndrome (HCC)   Time coordinating discharge: 

## 2018-05-27 DIAGNOSIS — K567 Ileus, unspecified: Secondary | ICD-10-CM | POA: Diagnosis not present

## 2018-05-27 DIAGNOSIS — K9189 Other postprocedural complications and disorders of digestive system: Secondary | ICD-10-CM | POA: Diagnosis not present

## 2018-06-04 DIAGNOSIS — I774 Celiac artery compression syndrome: Secondary | ICD-10-CM | POA: Diagnosis not present

## 2018-06-29 DIAGNOSIS — L84 Corns and callosities: Secondary | ICD-10-CM | POA: Diagnosis not present

## 2019-03-09 DIAGNOSIS — R69 Illness, unspecified: Secondary | ICD-10-CM | POA: Diagnosis not present

## 2019-05-09 DIAGNOSIS — Z1231 Encounter for screening mammogram for malignant neoplasm of breast: Secondary | ICD-10-CM | POA: Diagnosis not present

## 2019-05-10 DIAGNOSIS — H5213 Myopia, bilateral: Secondary | ICD-10-CM | POA: Diagnosis not present

## 2019-05-10 DIAGNOSIS — H2513 Age-related nuclear cataract, bilateral: Secondary | ICD-10-CM | POA: Diagnosis not present

## 2019-05-10 DIAGNOSIS — E119 Type 2 diabetes mellitus without complications: Secondary | ICD-10-CM | POA: Diagnosis not present

## 2019-05-10 DIAGNOSIS — H52203 Unspecified astigmatism, bilateral: Secondary | ICD-10-CM | POA: Diagnosis not present

## 2019-05-16 DIAGNOSIS — Z0100 Encounter for examination of eyes and vision without abnormal findings: Secondary | ICD-10-CM | POA: Diagnosis not present

## 2019-05-20 DIAGNOSIS — E119 Type 2 diabetes mellitus without complications: Secondary | ICD-10-CM | POA: Diagnosis not present

## 2019-05-20 DIAGNOSIS — M899 Disorder of bone, unspecified: Secondary | ICD-10-CM | POA: Diagnosis not present

## 2019-05-20 DIAGNOSIS — Z79899 Other long term (current) drug therapy: Secondary | ICD-10-CM | POA: Diagnosis not present

## 2019-05-20 DIAGNOSIS — E782 Mixed hyperlipidemia: Secondary | ICD-10-CM | POA: Diagnosis not present

## 2019-05-26 DIAGNOSIS — E1169 Type 2 diabetes mellitus with other specified complication: Secondary | ICD-10-CM | POA: Diagnosis not present

## 2019-05-26 DIAGNOSIS — K559 Vascular disorder of intestine, unspecified: Secondary | ICD-10-CM | POA: Diagnosis not present

## 2019-05-26 DIAGNOSIS — Z0000 Encounter for general adult medical examination without abnormal findings: Secondary | ICD-10-CM | POA: Diagnosis not present

## 2019-05-26 DIAGNOSIS — G720 Drug-induced myopathy: Secondary | ICD-10-CM | POA: Diagnosis not present

## 2019-05-26 DIAGNOSIS — K219 Gastro-esophageal reflux disease without esophagitis: Secondary | ICD-10-CM | POA: Diagnosis not present

## 2019-05-26 DIAGNOSIS — M8580 Other specified disorders of bone density and structure, unspecified site: Secondary | ICD-10-CM | POA: Diagnosis not present

## 2019-05-26 DIAGNOSIS — Z79899 Other long term (current) drug therapy: Secondary | ICD-10-CM | POA: Diagnosis not present

## 2019-05-26 DIAGNOSIS — R8290 Unspecified abnormal findings in urine: Secondary | ICD-10-CM | POA: Diagnosis not present

## 2019-05-26 DIAGNOSIS — Z1211 Encounter for screening for malignant neoplasm of colon: Secondary | ICD-10-CM | POA: Diagnosis not present

## 2019-05-26 DIAGNOSIS — E782 Mixed hyperlipidemia: Secondary | ICD-10-CM | POA: Diagnosis not present

## 2019-05-27 DIAGNOSIS — Z803 Family history of malignant neoplasm of breast: Secondary | ICD-10-CM | POA: Diagnosis not present

## 2019-05-27 DIAGNOSIS — K219 Gastro-esophageal reflux disease without esophagitis: Secondary | ICD-10-CM | POA: Diagnosis not present

## 2019-05-27 DIAGNOSIS — Z823 Family history of stroke: Secondary | ICD-10-CM | POA: Diagnosis not present

## 2019-05-27 DIAGNOSIS — Z833 Family history of diabetes mellitus: Secondary | ICD-10-CM | POA: Diagnosis not present

## 2019-05-27 DIAGNOSIS — Z880 Allergy status to penicillin: Secondary | ICD-10-CM | POA: Diagnosis not present

## 2019-05-27 DIAGNOSIS — I10 Essential (primary) hypertension: Secondary | ICD-10-CM | POA: Diagnosis not present

## 2019-05-27 DIAGNOSIS — Z809 Family history of malignant neoplasm, unspecified: Secondary | ICD-10-CM | POA: Diagnosis not present

## 2019-05-27 DIAGNOSIS — Z87891 Personal history of nicotine dependence: Secondary | ICD-10-CM | POA: Diagnosis not present

## 2019-05-27 DIAGNOSIS — Z8249 Family history of ischemic heart disease and other diseases of the circulatory system: Secondary | ICD-10-CM | POA: Diagnosis not present

## 2019-05-30 DIAGNOSIS — Z1211 Encounter for screening for malignant neoplasm of colon: Secondary | ICD-10-CM | POA: Diagnosis not present

## 2019-06-14 ENCOUNTER — Other Ambulatory Visit: Payer: Self-pay | Admitting: Vascular Surgery

## 2019-06-14 DIAGNOSIS — I774 Celiac artery compression syndrome: Secondary | ICD-10-CM

## 2019-06-28 ENCOUNTER — Other Ambulatory Visit: Payer: Self-pay

## 2019-06-28 DIAGNOSIS — K900 Celiac disease: Secondary | ICD-10-CM

## 2019-06-28 NOTE — Progress Notes (Signed)
Attempt to call Dr. Bosie Helper office regarding CTA chest- No answer on Lynn's # - will rerty

## 2019-06-28 NOTE — Progress Notes (Signed)
[name] @ MD office regarding CTA order, she will research and call back regarding CTA chest vs CTA abdominal based on diagnosis

## 2019-06-30 ENCOUNTER — Ambulatory Visit
Admission: RE | Admit: 2019-06-30 | Discharge: 2019-06-30 | Disposition: A | Discharge status: Home / Self Care | Payer: Medicare HMO | Source: Ambulatory Visit | Attending: Vascular Surgery | Admitting: Vascular Surgery

## 2019-06-30 DIAGNOSIS — K900 Celiac disease: Secondary | ICD-10-CM

## 2019-06-30 DIAGNOSIS — N281 Cyst of kidney, acquired: Secondary | ICD-10-CM | POA: Diagnosis not present

## 2019-06-30 MED ORDER — IOPAMIDOL (ISOVUE-370) INJECTION 76%
75.0000 mL | Freq: Once | INTRAVENOUS | Status: AC | PRN
  Administered 2019-06-30: 14:00:00 75 mL via INTRAVENOUS

## 2019-07-04 ENCOUNTER — Telehealth (HOSPITAL_COMMUNITY): Payer: Self-pay

## 2019-07-04 NOTE — Telephone Encounter (Signed)

## 2019-07-05 ENCOUNTER — Encounter: Payer: Self-pay | Admitting: Vascular Surgery

## 2019-07-05 ENCOUNTER — Other Ambulatory Visit: Payer: Self-pay

## 2019-07-05 ENCOUNTER — Ambulatory Visit: Payer: Medicare HMO | Admitting: Vascular Surgery

## 2019-07-05 VITALS — BP 151/66 | HR 78 | Temp 97.7°F | Resp 20 | Ht 65.0 in | Wt 162.0 lb

## 2019-07-05 DIAGNOSIS — I771 Stricture of artery: Secondary | ICD-10-CM

## 2019-07-05 DIAGNOSIS — I774 Celiac artery compression syndrome: Secondary | ICD-10-CM

## 2019-07-05 NOTE — Progress Notes (Signed)
[facility]in Specialist of [place]  Patient name: [name] MRN: [id number] DOB: Oct 25, 1940 Sex: female  REASON FOR CONSULT: Discuss celiac artery stenosis  HPI: [name] is a 79 y.o. female, who is known to me from a prior evaluation in September 2020.  She had abdominal discomfort and was found by CT scan to have celiac artery compression.  I had had a long discussion with her at that time and she did appear to have symptoms related to this.  I discussed with her the somewhat controversial diagnosis and that there were certainly well respected experts who felt that the syndrome did not exist and others that it was underrecognized.  She did appear to be symptomatic.  She was evaluated by Dr. Kieth Brightly and underwent laparoscopic decompression of her median arcuate ligament in December 2020.  She reports that she did have improvement in her symptoms.  She reports that she had a significant amount of pain following the procedure and had a 4-day hospitalization.  She specifically denies any food fear or weight loss.  She does report that she has had some persistent discomfort across her lower chest that she relates to the symptoms that she had had prior to the procedure.  She has a great deal of food intolerances but reports as long as she is careful she does not have difficulty with eating.  In looking back at my evaluation from September 2020 she has gained 8 pounds since that time and now weighs 162 pounds.  She looks quite good today.  Past Medical History:  Diagnosis Date  . Diabetes mellitus without complication (HCC)    diet controlled  . GERD (gastroesophageal reflux disease)   . History of appendicitis    childhood  . History of gallstones   . History of gastric polyp   . History of gastritis   . History of hiatal hernia 02/25/2018   Small, noted on CT Angio Abd/pelvis  . History of migraine   . History of tonsillitis    childhood  .  Hypertension   . 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 Multiple drug allergies   . PONV (postoperative nausea and vomiting)   . Thoracic aorta atherosclerosis ([place]) 02/03/2018   Noted on CXR  . TMJ (dislocation of temporomandibular joint)     Family History  Problem Relation Age of Onset  . Anesthesia problems Neg Hx     SOCIAL HISTORY: Social History   Socioeconomic History  . Marital status: Married    Spouse name: Not on file  . Number of children: Not on file  . Years of education: Not on file  . Highest education level: Not on file  Occupational History  . Not on file  Tobacco Use  . Smoking status: Former Smoker    Packs/day: 2.00    Years: 8.00    Pack years: 16.00    Types: Cigarettes  . Smokeless tobacco: Never Used  . Tobacco comment: quit smoking 1968  Substance and Sexual Activity  . Alcohol use: No  . Drug use: No  . Sexual activity: Yes    Birth control/protection: Surgical  Other Topics Concern  . Not on file  Social History Narrative  . Not on file   Social Determinants of Health   Financial Resource Strain:   . Difficulty of Paying Living Expenses: Not on file  Food Insecurity:   . Worried About Running  Out of Food in the Last Year: Not on file  . Ran Out of Food in the Last Year: Not on file  Transportation Needs:   . Lack of Transportation (Medical): Not on file  . Lack of Transportation (Non-Medical): Not on file  Physical Activity:   . Days of Exercise per Week: Not on file  . Minutes of Exercise per Session: Not on file  Stress:   . Feeling of Stress : Not on file  Social Connections:   . Frequency of Communication with Friends and Family: Not on file  . Frequency of Social Gatherings with Friends and Family: Not on file  . Attends Religious Services: Not on file  . Active Member of Clubs or Organizations: Not on file  . Attends Banker  Meetings: Not on file  . Marital Status: Not on file  Intimate Partner Violence:   . Fear of Current or Ex-Partner: Not on file  . Emotionally Abused: Not on file  . Physically Abused: Not on file  . Sexually Abused: Not on file    Allergies  Allergen Reactions  . Other Anaphylaxis and Diarrhea    Feathers Anaphylaxis  Biaxin- cold sweats Wool-rash   . Penicillins Shortness Of Breath, Itching and Other (See Comments)    Has patient had a PCN reaction causing immediate rash, facial/tongue/throat swelling, SOB or lightheadedness with hypotension: No Has patient had a PCN reaction causing severe rash involving mucus membranes or skin necrosis: No Has patient had a PCN reaction that required hospitalization: No Has patient had a PCN reaction occurring within the last 10 years: No If all of the above answers are "NO", then may proceed with Cephalosporin use.   . Cephalexin Other (See Comments)    headache  . Erythromycin Diarrhea and Other (See Comments)    Cold sweats  . Lactose Intolerance (Gi) Other (See Comments)    Unknown    Current Outpatient Medications  Medication Sig Dispense Refill  . losartan (COZAAR) 100 MG tablet Take 100 mg by mouth daily.     No current facility-administered medications for this visit.    REVIEW OF SYSTEMS:  [X]  denotes positive finding, [ ]  denotes negative finding Cardiac  Comments:  Chest pain or chest pressure:    Shortness of breath upon exertion:    Short of breath when lying flat:    Irregular heart rhythm:        Vascular    Pain in calf, thigh, or hip brought on by ambulation:    Pain in feet at night that wakes you up from your sleep:     Blood clot in your veins:    Leg swelling:         Pulmonary    Oxygen at home:    Productive cough:     Wheezing:         Neurologic    Sudden weakness in arms or legs:     Sudden numbness in arms or legs:     Sudden onset of difficulty speaking or slurred speech:    Temporary loss  of vision in one eye:     Problems with dizziness:         Gastrointestinal    Blood in stool:     Vomited blood:         Genitourinary    Burning when urinating:     Blood in urine:        Psychiatric    Major depression:  Hematologic    Bleeding problems:    Problems with blood clotting too easily:        Skin    Rashes or ulcers:        Constitutional    Fever or chills:      PHYSICAL EXAM: Vitals:   07/05/19 1318  BP: (!) 151/66  Pulse: 78  Resp: 20  Temp: 97.7 F (36.5 C)  SpO2: 95%  Weight: 162 lb (73.5 kg)  Height: 5\' 5"  (1.651 m)    GENERAL: The patient is a well-nourished female, in no acute distress. The vital signs are documented above. CARDIOVASCULAR: Abdomen soft and nontender.  No bruits noted.  2+ radial and 2+ dorsalis pedis pulses bilaterally PULMONARY: There is good air exchange  ABDOMEN: Soft and non-tender  MUSCULOSKELETAL: There are no major deformities or cyanosis. NEUROLOGIC: No focal weakness or paresthesias are detected. SKIN: There are no ulcers or rashes noted. PSYCHIATRIC: The patient has a normal affect.  DATA:  CT scan from 06/30/2019 was reviewed with the patient.  I reviewed the actual CT images with her.  This does show subtotal occlusion and possible total occlusion of her celiac artery at its origin with large splenic and hepatic branches.  Her superior mesenteric artery and inferior mesenteric artery are widely patent.  MEDICAL ISSUES: Patient has anatomic celiac artery stenosis.  She does not have any symptoms related to this and has had improvement since her median arcuate ligament release.  I would not recommend any further evaluation such as arteriography or endovascular stenting.  I feel that there is a high likelihood that she could have successful treatment but since she is not having clear-cut mesenteric ischemic symptoms, I do not feel that this is warranted.  I did discuss the symptoms again with the patient and  she will notify 07/02/2019 should this occur.  Otherwise she will see [place] again on an as-needed basis   [place], MD [facility] Vascular and Vein Specialists of [facility] Tel [phone] Pager [phone]

## 2019-07-07 ENCOUNTER — Ambulatory Visit: Payer: Medicare HMO | Attending: Internal Medicine

## 2019-07-07 DIAGNOSIS — Z23 Encounter for immunization: Secondary | ICD-10-CM

## 2019-07-07 NOTE — Progress Notes (Signed)
   Covid-19 Vaccination Clinic  Name:  [name]    MRN: [id number] DOB: 1940-07-06  07/07/2019  [name] was observed post Covid-19 immunization for 30 minutes based on pre-vaccination screening without incidence. She was provided with Vaccine Information Sheet and instruction to access the V-Safe system.   [name] was instructed to call 911 with any severe reactions post vaccine: Marland Kitchen Difficulty breathing  . Swelling of your face and throat  . A fast heartbeat  . A bad rash all over your body  . Dizziness and weakness    Immunizations Administered    Name Date Dose VIS Date Route   Pfizer COVID-19 Vaccine 07/07/2019  8:57 AM 0.3 mL 05/27/2019 Intramuscular   Manufacturer: [employer], [employer]   Lot: [id number]   NDC: [address]-1

## 2019-07-25 ENCOUNTER — Ambulatory Visit: Payer: Medicare HMO | Attending: Internal Medicine

## 2019-07-25 DIAGNOSIS — Z23 Encounter for immunization: Secondary | ICD-10-CM | POA: Insufficient documentation

## 2019-07-25 NOTE — Progress Notes (Signed)
   Covid-19 Vaccination Clinic  Name:  [name]    MRN: [id number] DOB: [birth year]  07/25/2019  [name] was observed post Covid-19 immunization for 15 minutes without incidence. She was provided with Vaccine Information Sheet and instruction to access the V-Safe system.   [name] was instructed to call 911 with any severe reactions post vaccine: Marland Kitchen Difficulty breathing  . Swelling of your face and throat  . A fast heartbeat  . A bad rash all over your body  . Dizziness and weakness    Immunizations Administered    Name Date Dose VIS Date Route   Pfizer COVID-19 Vaccine 07/25/2019  8:32 AM 0.3 mL 05/27/2019 Intramuscular   Manufacturer: [employer], [employer]   Lot: [id number]   NDC: [address]-7

## 2019-09-08 DIAGNOSIS — E1169 Type 2 diabetes mellitus with other specified complication: Secondary | ICD-10-CM | POA: Diagnosis not present

## 2019-09-08 DIAGNOSIS — K219 Gastro-esophageal reflux disease without esophagitis: Secondary | ICD-10-CM | POA: Diagnosis not present

## 2019-09-08 DIAGNOSIS — K559 Vascular disorder of intestine, unspecified: Secondary | ICD-10-CM | POA: Diagnosis not present

## 2019-09-08 DIAGNOSIS — R8290 Unspecified abnormal findings in urine: Secondary | ICD-10-CM | POA: Diagnosis not present

## 2019-09-08 DIAGNOSIS — Z1211 Encounter for screening for malignant neoplasm of colon: Secondary | ICD-10-CM | POA: Diagnosis not present

## 2019-09-08 DIAGNOSIS — G720 Drug-induced myopathy: Secondary | ICD-10-CM | POA: Diagnosis not present

## 2019-09-08 DIAGNOSIS — Z79899 Other long term (current) drug therapy: Secondary | ICD-10-CM | POA: Diagnosis not present

## 2019-09-08 DIAGNOSIS — E782 Mixed hyperlipidemia: Secondary | ICD-10-CM | POA: Diagnosis not present

## 2019-09-08 DIAGNOSIS — Z0000 Encounter for general adult medical examination without abnormal findings: Secondary | ICD-10-CM | POA: Diagnosis not present

## 2019-09-08 DIAGNOSIS — M8580 Other specified disorders of bone density and structure, unspecified site: Secondary | ICD-10-CM | POA: Diagnosis not present

## 2020-03-05 DIAGNOSIS — R69 Illness, unspecified: Secondary | ICD-10-CM | POA: Diagnosis not present

## 2020-03-24 IMAGING — CT CT CTA ABD/PEL W/CM AND/OR W/O CM
2 of 3 series · 12 of 32 positions shown, 17 images · IV contrast (APPLIED)
Comparison: 02/25/2018

CLINICAL DATA: History of intestinal malabsorption. History of
median arcuate ligament syndrome and median arcuate ligament release
procedure.

EXAM:
CT ANGIOGRAPHY ABDOMEN AND PELVIS WITH CONTRAST AND WITHOUT CONTRAST
TECHNIQUE: Multidetector CT imaging of the abdomen and pelvis was performed
using the standard protocol during bolus administration of
intravenous contrast. Multiplanar reconstructed images and MIPs were
obtained and reviewed to evaluate the vascular anatomy.
CONTRAST:  75mL [id number] IOPAMIDOL (LKFU4U-Y1Z) INJECTION 76%

[Series 4: mesenteric angio · axial · 0.70mm/px · z∈[-413,-145]mm · 6 of 242 slices shown]
[im 27/242  soft-tissue]
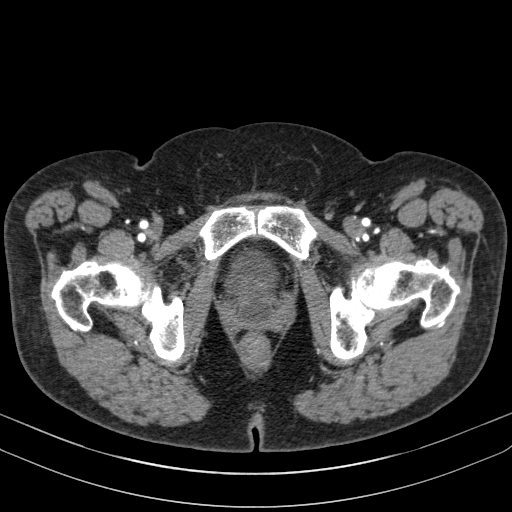
[im 54/242  soft-tissue]
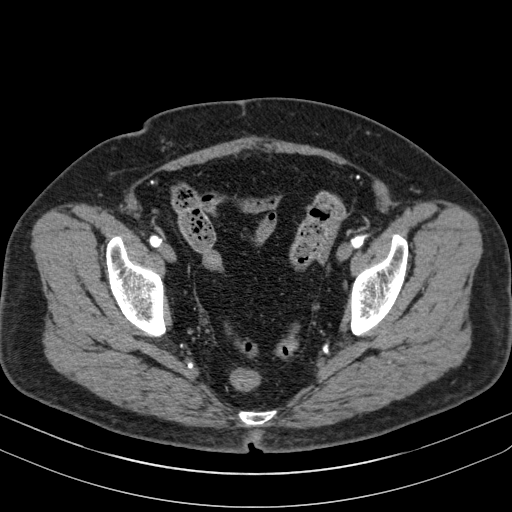
[im 81/242  soft-tissue]
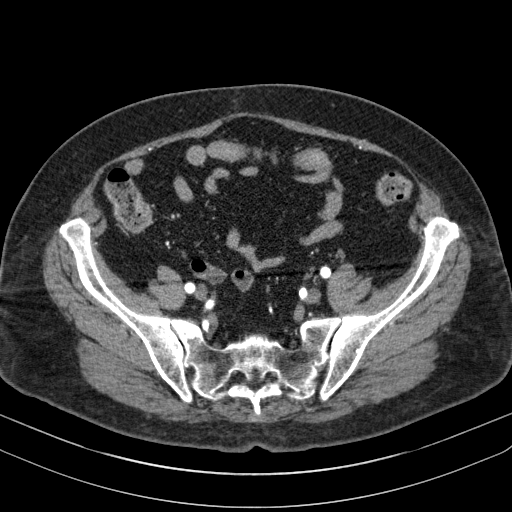
[im 108/242  soft-tissue]
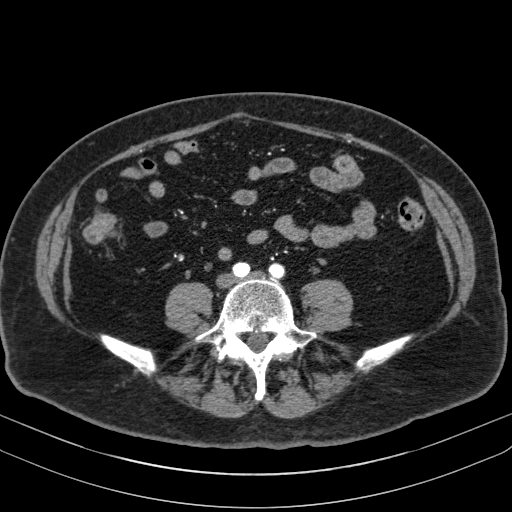
[im 134/242  soft-tissue]
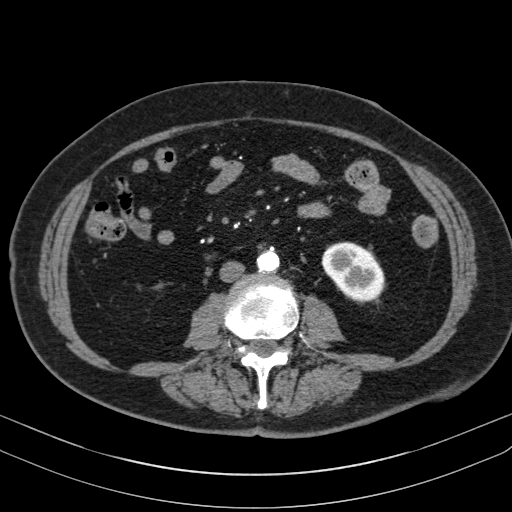
[im 161/242  soft-tissue]
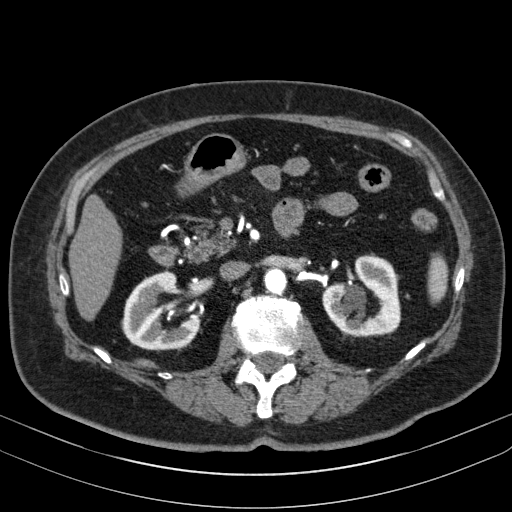

[Series 5: venous · axial · portal-venous · 0.70mm/px · z∈[-399,-49]mm · 6 of 98 slices shown, 11 images]
[im 14/98  soft-tissue]
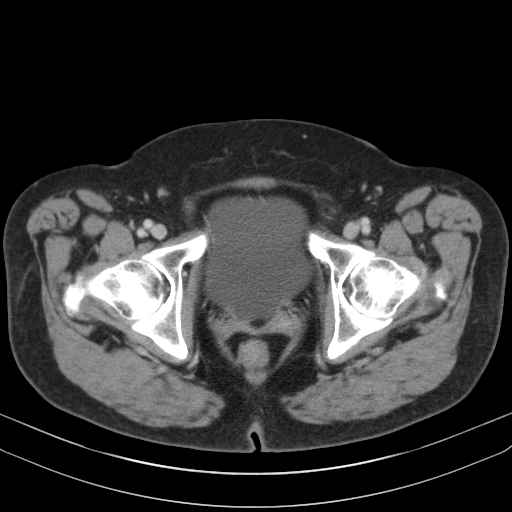
[im 14/98  bone]
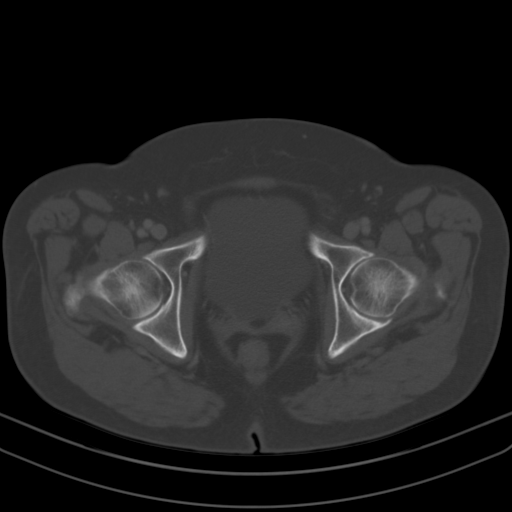
[im 28/98  soft-tissue]
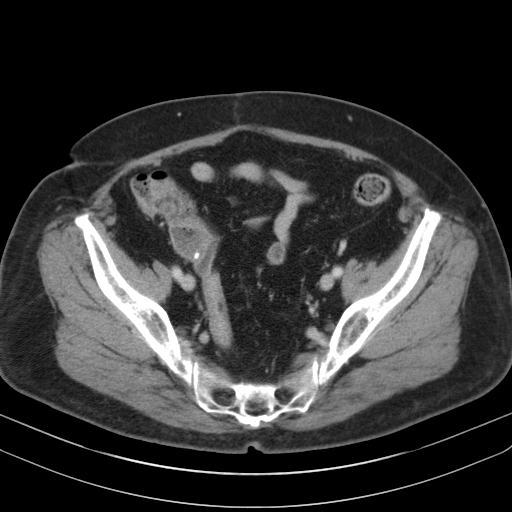
[im 42/98  soft-tissue]
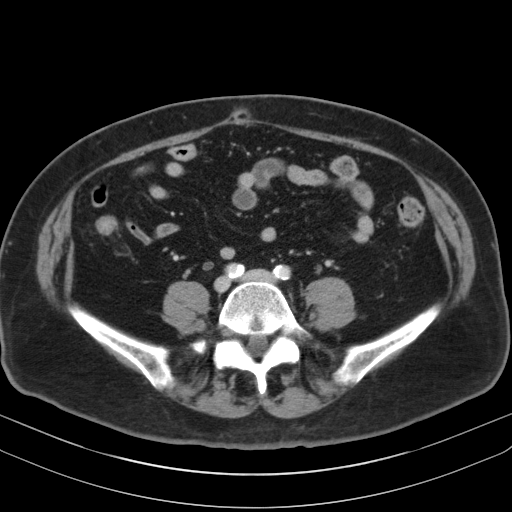
[im 42/98  lung]
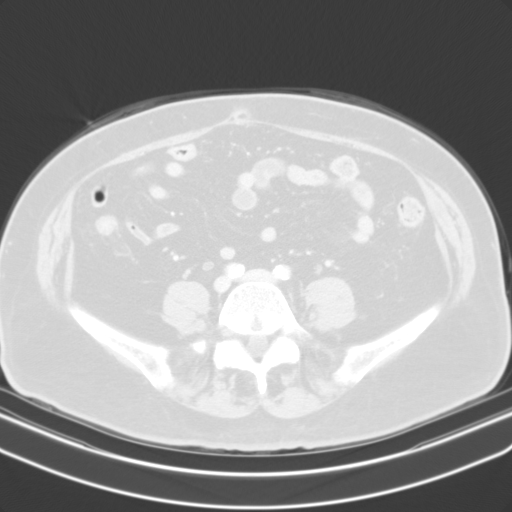
[im 56/98  soft-tissue]
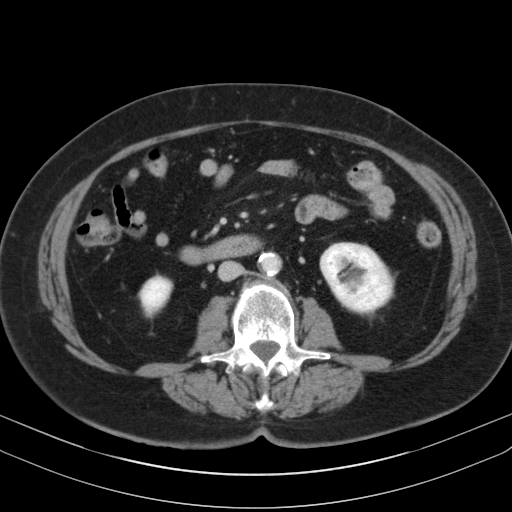
[im 56/98  lung]
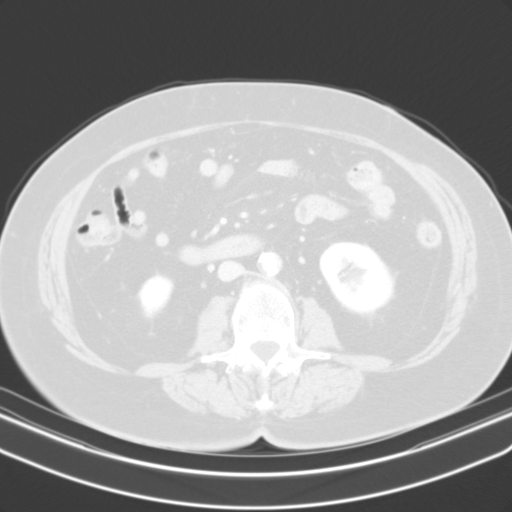
[im 70/98  soft-tissue]
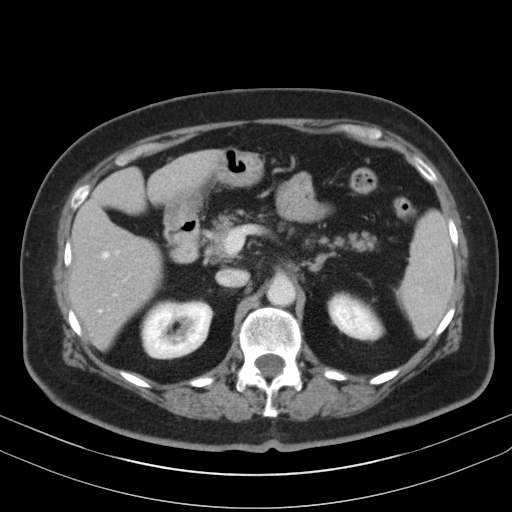
[im 70/98  lung]
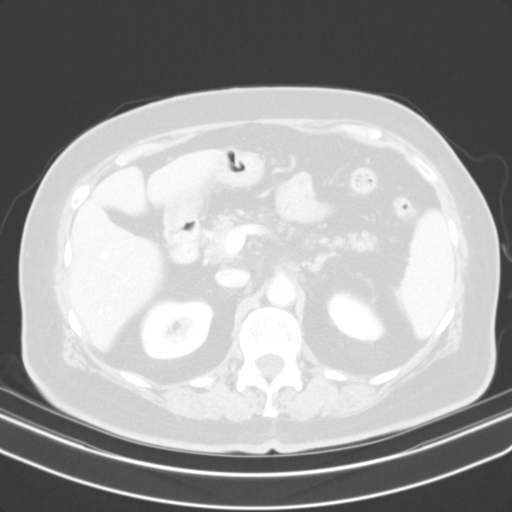
[im 84/98  soft-tissue]
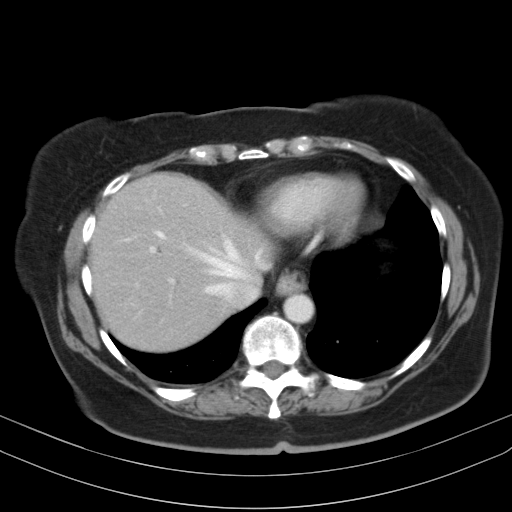
[im 84/98  lung]
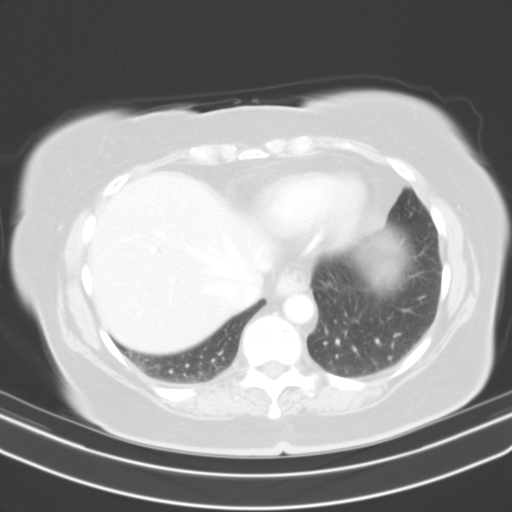

[12 of 32 positions shown; findings below may reference images not displayed]

FINDINGS: VASCULAR

Aorta: Mild atherosclerotic disease in the distal descending
thoracic aorta without enlargement. Atherosclerotic disease in the
abdominal aorta without aneurysm or dissection.

Celiac: Surgical clips and postsurgical changes near the celiac
artery trunk. Origin of the celiac trunk is very small which is
similar to the previous examination. There is occlusion or near
occlusion of the proximal celiac trunk. Caliber of the celiac trunk
is closer to normal near the origin of the left gastric artery. Left
gastric artery is patent. Splenic artery and common hepatic artery
are patent. Gastroduodenal artery is patent. Again noted are
prominent vessels at the pancreaticoduodenal arcade suggestive for
collateral flow from the SMA. Incidentally, the right hepatic artery
appears to come off the common hepatic artery.

SMA: Patent without evidence of aneurysm, dissection, vasculitis or
significant stenosis.

Renals: Right renal artery is patent without significant stenosis.
No evidence for vasculitis or FMD in the right renal artery. Main
left renal artery remains patent with mild stenosis in the proximal
aspect related to calcified plaque. Again noted is irregularity in
the proximal and mid left renal artery which is similar to the
previous examination and cannot exclude FMD. Again noted is a small
accessory left renal artery supplying the lower pole.

IMA: IMA is patent.

Inflow: Common, internal and external iliac arteries are patent with
mild plaque. No significant stenosis.

Proximal Outflow: Proximal femoral arteries are patent bilaterally.

Veins: Portal venous system is patent. IVC and renal veins are
patent. Compression on the left common iliac vein from the right
common iliac artery. No evidence for thrombus.

Review of the MIP images confirms the above findings.

NON-VASCULAR

Lower chest: 3 mm nodule in the left lower lobe on sequence 12,
image 37. This nodule appears to be new since 0910. No pleural
effusions.

Hepatobiliary: Cholecystectomy. Mild intrahepatic biliary dilatation
is similar to the previous examination. No suspicious liver lesion.

Pancreas: Unremarkable. No pancreatic ductal dilatation or
surrounding inflammatory changes.

Spleen: Normal in size without focal abnormality.

Adrenals/Urinary Tract: Normal adrenal glands. Again noted are
multiple parapelvic left renal cysts without hydronephrosis. No
suspicious renal lesions. Normal appearance of the urinary bladder.

Stomach/Bowel: Small hiatal hernia. Normal appearance of the
duodenum. Minimal stranding within the central abdominal mesentery
Dq imilar to the previous examination. No evidence for bowel
obstruction. No evidence for focal bowel inflammation. Probable
appendectomy.

Lymphatic: No abdominopelvic lymphadenopathy.

Reproductive: Status post hysterectomy. No adnexal masses.

Other: Negative for ascites.  Negative for free air.

Musculoskeletal: Disc space narrowing at L2-L3.
IMPRESSION: VASCULAR

1. Minimal blood flow at the origin and proximal aspect of the
celiac trunk. Celiac trunk is nearly occluded. Prominent
pancreaticoduodenal arcade vessels and compatible with collateral
flow through the SMA.
2.  Aortic Atherosclerosis ([id number]).
3. Stable mild irregularity in the left renal artery. Fibromuscular
dysplasia in this area cannot be excluded. Again noted is mild
stenosis in the proximal left renal artery related to calcified
plaque.

NON-VASCULAR

1. No acute abnormality in the abdomen or pelvis.
2. New 3 mm nodule in the left lower lobe is indeterminate. No
follow-up needed if patient is low-risk. Non-contrast chest CT can
be considered in 12 months if patient is high-risk. This
recommendation follows the consensus statement: Guidelines for
Management of Incidental Pulmonary Nodules Detected on CT Images:
3. Left renal parapelvic cysts.

## 2020-05-15 DIAGNOSIS — E119 Type 2 diabetes mellitus without complications: Secondary | ICD-10-CM | POA: Diagnosis not present

## 2020-05-15 DIAGNOSIS — H5213 Myopia, bilateral: Secondary | ICD-10-CM | POA: Diagnosis not present

## 2020-05-15 DIAGNOSIS — H2513 Age-related nuclear cataract, bilateral: Secondary | ICD-10-CM | POA: Diagnosis not present

## 2020-05-19 DIAGNOSIS — Z1231 Encounter for screening mammogram for malignant neoplasm of breast: Secondary | ICD-10-CM | POA: Diagnosis not present

## 2020-05-25 DIAGNOSIS — M899 Disorder of bone, unspecified: Secondary | ICD-10-CM | POA: Diagnosis not present

## 2020-05-25 DIAGNOSIS — E782 Mixed hyperlipidemia: Secondary | ICD-10-CM | POA: Diagnosis not present

## 2020-05-25 DIAGNOSIS — Z0000 Encounter for general adult medical examination without abnormal findings: Secondary | ICD-10-CM | POA: Diagnosis not present

## 2020-05-25 DIAGNOSIS — E1169 Type 2 diabetes mellitus with other specified complication: Secondary | ICD-10-CM | POA: Diagnosis not present

## 2020-05-25 DIAGNOSIS — K559 Vascular disorder of intestine, unspecified: Secondary | ICD-10-CM | POA: Diagnosis not present

## 2020-05-25 DIAGNOSIS — Z79899 Other long term (current) drug therapy: Secondary | ICD-10-CM | POA: Diagnosis not present

## 2020-05-25 DIAGNOSIS — M8580 Other specified disorders of bone density and structure, unspecified site: Secondary | ICD-10-CM | POA: Diagnosis not present

## 2020-05-25 DIAGNOSIS — I10 Essential (primary) hypertension: Secondary | ICD-10-CM | POA: Diagnosis not present

## 2020-05-29 DIAGNOSIS — S99911A Unspecified injury of right ankle, initial encounter: Secondary | ICD-10-CM | POA: Diagnosis not present

## 2020-05-29 DIAGNOSIS — S99921A Unspecified injury of right foot, initial encounter: Secondary | ICD-10-CM | POA: Diagnosis not present

## 2020-05-30 DIAGNOSIS — E1169 Type 2 diabetes mellitus with other specified complication: Secondary | ICD-10-CM | POA: Diagnosis not present

## 2020-05-30 DIAGNOSIS — E782 Mixed hyperlipidemia: Secondary | ICD-10-CM | POA: Diagnosis not present

## 2020-05-30 DIAGNOSIS — R8290 Unspecified abnormal findings in urine: Secondary | ICD-10-CM | POA: Diagnosis not present

## 2020-05-30 DIAGNOSIS — Z0000 Encounter for general adult medical examination without abnormal findings: Secondary | ICD-10-CM | POA: Diagnosis not present

## 2020-05-30 DIAGNOSIS — M8580 Other specified disorders of bone density and structure, unspecified site: Secondary | ICD-10-CM | POA: Diagnosis not present

## 2020-05-30 DIAGNOSIS — I10 Essential (primary) hypertension: Secondary | ICD-10-CM | POA: Diagnosis not present

## 2020-05-30 DIAGNOSIS — K559 Vascular disorder of intestine, unspecified: Secondary | ICD-10-CM | POA: Diagnosis not present

## 2020-05-30 DIAGNOSIS — Z23 Encounter for immunization: Secondary | ICD-10-CM | POA: Diagnosis not present

## 2020-05-30 DIAGNOSIS — D696 Thrombocytopenia, unspecified: Secondary | ICD-10-CM | POA: Diagnosis not present

## 2020-05-30 DIAGNOSIS — Z79899 Other long term (current) drug therapy: Secondary | ICD-10-CM | POA: Diagnosis not present

## 2020-08-29 DIAGNOSIS — E1169 Type 2 diabetes mellitus with other specified complication: Secondary | ICD-10-CM | POA: Diagnosis not present

## 2020-08-29 DIAGNOSIS — R8290 Unspecified abnormal findings in urine: Secondary | ICD-10-CM | POA: Diagnosis not present

## 2020-08-29 DIAGNOSIS — D696 Thrombocytopenia, unspecified: Secondary | ICD-10-CM | POA: Diagnosis not present

## 2020-08-29 DIAGNOSIS — I10 Essential (primary) hypertension: Secondary | ICD-10-CM | POA: Diagnosis not present

## 2020-08-29 DIAGNOSIS — Z79899 Other long term (current) drug therapy: Secondary | ICD-10-CM | POA: Diagnosis not present

## 2020-08-29 DIAGNOSIS — Z23 Encounter for immunization: Secondary | ICD-10-CM | POA: Diagnosis not present

## 2020-08-29 DIAGNOSIS — K559 Vascular disorder of intestine, unspecified: Secondary | ICD-10-CM | POA: Diagnosis not present

## 2020-08-29 DIAGNOSIS — M8580 Other specified disorders of bone density and structure, unspecified site: Secondary | ICD-10-CM | POA: Diagnosis not present

## 2020-08-29 DIAGNOSIS — E782 Mixed hyperlipidemia: Secondary | ICD-10-CM | POA: Diagnosis not present

## 2020-11-26 ENCOUNTER — Emergency Department (HOSPITAL_BASED_OUTPATIENT_CLINIC_OR_DEPARTMENT_OTHER): Payer: Medicare HMO

## 2020-11-26 ENCOUNTER — Emergency Department (HOSPITAL_BASED_OUTPATIENT_CLINIC_OR_DEPARTMENT_OTHER)
Admission: EM | Admit: 2020-11-26 | Discharge: 2020-11-26 | Disposition: A | Discharge status: Home / Self Care | Payer: Medicare HMO | Attending: Emergency Medicine | Admitting: Emergency Medicine

## 2020-11-26 ENCOUNTER — Other Ambulatory Visit: Payer: Self-pay

## 2020-11-26 ENCOUNTER — Encounter (HOSPITAL_BASED_OUTPATIENT_CLINIC_OR_DEPARTMENT_OTHER): Payer: Self-pay | Admitting: *Deleted

## 2020-11-26 DIAGNOSIS — Z79899 Other long term (current) drug therapy: Secondary | ICD-10-CM | POA: Diagnosis not present

## 2020-11-26 DIAGNOSIS — R42 Dizziness and giddiness: Secondary | ICD-10-CM

## 2020-11-26 DIAGNOSIS — Z87891 Personal history of nicotine dependence: Secondary | ICD-10-CM | POA: Diagnosis not present

## 2020-11-26 DIAGNOSIS — R29818 Other symptoms and signs involving the nervous system: Secondary | ICD-10-CM | POA: Diagnosis not present

## 2020-11-26 DIAGNOSIS — I10 Essential (primary) hypertension: Secondary | ICD-10-CM | POA: Diagnosis not present

## 2020-11-26 DIAGNOSIS — R0602 Shortness of breath: Secondary | ICD-10-CM | POA: Insufficient documentation

## 2020-11-26 DIAGNOSIS — U099 Post covid-19 condition, unspecified: Secondary | ICD-10-CM | POA: Diagnosis not present

## 2020-11-26 DIAGNOSIS — R059 Cough, unspecified: Secondary | ICD-10-CM | POA: Insufficient documentation

## 2020-11-26 DIAGNOSIS — R414 Neurologic neglect syndrome: Secondary | ICD-10-CM | POA: Diagnosis not present

## 2020-11-26 LAB — URINALYSIS, ROUTINE W REFLEX MICROSCOPIC
Bilirubin Urine: NEGATIVE
Glucose, UA: NEGATIVE mg/dL
Hgb urine dipstick: NEGATIVE
Ketones, ur: NEGATIVE mg/dL
Nitrite: NEGATIVE
Protein, ur: NEGATIVE mg/dL
Specific Gravity, Urine: 1.014 (ref 1.005–1.030)
pH: 7 (ref 5.0–8.0)

## 2020-11-26 LAB — BASIC METABOLIC PANEL
Anion gap: 8 (ref 5–15)
BUN: 15 mg/dL (ref 8–23)
CO2: 29 mmol/L (ref 22–32)
Calcium: 9.6 mg/dL (ref 8.9–10.3)
Chloride: 104 mmol/L (ref 98–111)
Creatinine, Ser: 0.82 mg/dL (ref 0.44–1.00)
GFR, Estimated: >60 mL/min (ref >60)
Glucose, Bld: 113 mg/dL — ABNORMAL HIGH (ref 70–99)
Potassium: 4.2 mmol/L (ref 3.5–5.1)
Sodium: 141 mmol/L (ref 135–145)

## 2020-11-26 LAB — CBC
HCT: 38.9 % (ref 36.0–46.0)
Hemoglobin: 12.6 g/dL (ref 12.0–15.0)
MCH: 30.5 pg (ref 26.0–34.0)
MCHC: 32.4 g/dL (ref 30.0–36.0)
MCV: 94.2 fL (ref 80.0–100.0)
Platelets: 165 10*3/uL (ref 150–400)
RBC: 4.13 MIL/uL (ref 3.87–5.11)
RDW: 13.3 % (ref 11.5–15.5)
WBC: 6.8 10*3/uL (ref 4.0–10.5)
nRBC: 0 % (ref 0.0–0.2)

## 2020-11-26 LAB — TROPONIN I (HIGH SENSITIVITY): Troponin I (High Sensitivity): 4 ng/L (ref <18)

## 2020-11-26 LAB — PROTIME-INR
INR: 1 (ref 0.8–1.2)
Prothrombin Time: 13 seconds (ref 11.4–15.2)

## 2020-11-26 LAB — HEPATIC FUNCTION PANEL
ALT: 18 U/L (ref 0–44)
AST: 16 U/L (ref 15–41)
Albumin: 4.5 g/dL (ref 3.5–5.0)
Alkaline Phosphatase: 89 U/L (ref 38–126)
Bilirubin, Direct: 0.1 mg/dL (ref 0.0–0.2)
Indirect Bilirubin: 0.6 mg/dL (ref 0.3–0.9)
Total Bilirubin: 0.7 mg/dL (ref 0.3–1.2)
Total Protein: 7.3 g/dL (ref 6.5–8.1)

## 2020-11-26 LAB — BRAIN NATRIURETIC PEPTIDE: B Natriuretic Peptide: 36.2 pg/mL (ref 0.0–100.0)

## 2020-11-26 LAB — LACTIC ACID, PLASMA: Lactic Acid, Venous: 0.7 mmol/L (ref 0.5–1.9)

## 2020-11-26 MED ORDER — HYDRALAZINE HCL 25 MG PO TABS
25.0000 mg | ORAL_TABLET | Freq: Once | ORAL | Status: AC
  Administered 2020-11-26: 25 mg via ORAL
  Filled 2020-11-26: qty 1

## 2020-11-26 MED ORDER — HYDRALAZINE HCL 25 MG PO TABS: ORAL_TABLET | ORAL | 0 refills | Status: AC

## 2020-11-26 NOTE — ED Provider Notes (Signed)
[facility] EMERGENCY DEPT Provider Note   CSN: [id number] Arrival date & time: 11/26/20  1510     History Chief Complaint  Patient presents with   Dizziness    [name] is a 80 y.o. female.  HPI She reports that she is recovering from COVID.  She had cough and some shortness of breath.  She reports symptoms are improving but for about 5 days now she has been feeling dizzy.  She reports is most notable when she goes from sitting to standing.  Does not feel when she is lying down.  No syncopal episodes patient reports she has had somewhat of a headache for about the time she has been dizzy.  Is been a pressure quality throughout her head she also reports that the vision seems to have a little bit of a halo or some hazy light appearance.  No focal weakness numbness or tingling.  She has not taken any falls.    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   Multiple drug allergies    PONV (postoperative nausea and vomiting)    Thoracic aorta atherosclerosis (HCC) 02/03/2018   Noted on CXR   TMJ (dislocation of temporomandibular joint)     Patient Active Problem List   Diagnosis Date Noted   Median arcuate ligament syndrome (HCC) 05/19/2018    Past Surgical History:  Procedure Laterality Date   ABDOMINAL HYSTERECTOMY  1986   APPENDECTOMY     age 76   CHOLECYSTECTOMY  2009   lap chole   COLONOSCOPY     LAPAROSCOPIC ABDOMINAL EXPLORATION N/A 05/19/2018   Procedure:  LAPAROSCOPIC MEDIAN ARCUATE LIGAMENT RELEASE ERAS PATHWAY;  Surgeon: Sheliah Hatch De Blanch, MD;  Location: [facility] ORS;  Service: General;  Laterality: N/A;   TMJ ARTHROPLASTY  10/27/2011   Procedure: TEMPOROMANDIBULAR JOINT (TMJ) ARTHROPLASTY;  Surgeon: Georgia Lopes, DDS;  Location: [facility] OR;  Service: Oral Surgery;  Laterality: Right;  Right Temporomandibular Joint Arthrotomy, Meniscectomy, Arthroplasty   TONSILLECTOMY     TRANSFER / TRANSPLANT ANKLE TENDON SUPERFICIAL / DEEP  2013   rt ankle   UPPER GI ENDOSCOPY       OB History   No obstetric history on file.     Family History  Problem Relation Age of Onset   Anesthesia problems Neg Hx     Social History   Tobacco Use   Smoking status: Former    Packs/day: 2.00    Years: 8.00    Pack years: 16.00    Types: Cigarettes   Smokeless tobacco: Never   Tobacco comments:    quit smoking 1968  Vaping Use   Vaping Use: Never used  Substance Use Topics   Alcohol use: No   Drug use: No    Home Medications Prior to Admission medications   Medication Sig Start Date End Date Taking? Authorizing Provider  hydrALAZINE (APRESOLINE) 25 MG tablet You may take 1  tablet up to every 8 hours for blood pressure greater than 150 systolic or 90 diastolic after you have taken your regular medication. 11/26/20  Yes Arby Barrette, MD  losartan (COZAAR) 100 MG tablet Take 100 mg by mouth daily.    [provider]    Allergies    Other, Penicillins, Cephalexin, Erythromycin, and Lactose intolerance (gi)  Review of Systems   Review of Systems 10 systems reviewed and negative except as per HPI Physical Exam Updated Vital Signs BP (!) 184/59   Pulse (!) 57   Temp 98.8 F (37.1 C)   Resp 17   Ht 5\' 5"  (1.651 m)   Wt 72.1 kg   SpO2 100%   BMI 26.46 kg/m   Physical Exam Constitutional:      Appearance: Normal appearance.  HENT:     Head: Normocephalic and atraumatic.     Ears:     Comments: Right TM normal left TM has some  cerumen impaction.    Nose: Nose normal.     Mouth/Throat:     Mouth: Mucous membranes are moist.     Pharynx: Oropharynx is clear.  Eyes:     Extraocular Movements: Extraocular movements intact.     Pupils: Pupils are equal, round, and reactive to light.  Cardiovascular:     Rate and Rhythm: Normal rate and regular rhythm.  Pulmonary:     Comments: No respiratory distress.  Some crackles at the lung bases. Abdominal:     General: There is no distension.     Palpations: Abdomen is soft.     Tenderness: There is no abdominal tenderness. There is no guarding.  Musculoskeletal:        General: No swelling or tenderness. Normal range of motion.     Cervical back: Neck supple.     Right lower leg: No edema.     Left lower leg: No edema.  Skin:    General: Skin is warm and dry.  Neurological:     General: No focal deficit present.     Mental Status: She is alert and oriented to person, place, and time.     Cranial Nerves: No cranial nerve deficit.     Sensory: No sensory deficit.     Motor: No weakness.     Coordination: Coordination normal.     Comments: Patient is alert with clear mental status.  Cranial nerves II through XII intact.  Finger-nose exam normal.  Motor strength 5\5 upper and lower.  Speech clear.  Psychiatric:        Mood and Affect: Mood normal.    ED Results / Procedures / Treatments   Labs (all labs ordered are listed, but only abnormal results are displayed) Labs Reviewed  BASIC METABOLIC PANEL - Abnormal; Notable for the following components:      Result Value   Glucose, Bld 113 (*)    All other components within normal limits  URINALYSIS, ROUTINE W REFLEX MICROSCOPIC - Abnormal; Notable for the following components:   Color, Urine COLORLESS (*)    Leukocytes,Ua SMALL (*)    All other components within normal limits  CBC  HEPATIC FUNCTION PANEL  BRAIN NATRIURETIC PEPTIDE  LACTIC ACID, PLASMA  PROTIME-INR  LACTIC ACID, PLASMA  CBG MONITORING, ED   TROPONIN I (HIGH SENSITIVITY)  TROPONIN I (HIGH SENSITIVITY)    EKG EKG Interpretation  Date/Time:  Monday November 26 2020 15:20:33 EDT Ventricular Rate:  69 PR Interval:  170 QRS Duration: 68 QT Interval:  400 QTC Calculation: 428 R Axis:   20 Text Interpretation: Normal sinus rhythm Abnormal ECG no acute ischemic appearance. no sig change from previous Confirmed by Arby Barrette [phone]) on 11/26/2020 5:50:51 PM  Radiology CT Head Wo Contrast  Result Date: 11/26/2020 CLINICAL DATA:  80 year old female with neurologic deficit. EXAM: CT HEAD WITHOUT CONTRAST TECHNIQUE: Contiguous axial images were obtained from the base of the skull through the vertex without intravenous contrast. COMPARISON:  None. FINDINGS: Brain: The ventricles and sulci appropriate size for patient's age. Mild periventricular and deep white matter chronic microvascular ischemic changes. There is no acute intracranial hemorrhage. No mass effect or midline shift. No extra-axial fluid collection. Vascular: No hyperdense vessel or unexpected calcification. Skull: Normal. Negative for fracture or focal lesion. Sinuses/Orbits: There is diffuse mucoperiosteal thickening of paranasal sinuses with opacification of several ethmoid air cells. No air-fluid level. Mastoid air cells are clear. Other: None IMPRESSION: 1. No acute intracranial pathology. 2. Mild chronic microvascular ischemic changes. 3. Paranasal sinus disease. Electronically Signed   By: Elgie Collard M.D.   On: 11/26/2020 18:29   DG Chest Port 1 View  Result Date: 11/26/2020 CLINICAL DATA:  Dizziness EXAM: PORTABLE CHEST 1 VIEW COMPARISON:  02/03/2018 FINDINGS: The heart size and mediastinal contours are within normal limits. No focal airspace consolidation, pleural effusion, or pneumothorax. The visualized skeletal structures are unremarkable. IMPRESSION: No active disease. Electronically Signed   By: Duanne Guess D.O.   On: 11/26/2020 18:21     Procedures Procedures   Medications Ordered in ED Medications  hydrALAZINE (APRESOLINE) tablet 25 mg (25 mg Oral Given 11/26/20 1824)    ED Course  I have reviewed the triage vital signs and the nursing notes.  Pertinent labs & imaging results that were available during my care of the patient were reviewed by me and considered in my medical decision making (see chart for details).    MDM Rules/Calculators/A&P                          Patient is clinically well in appearance.  She has been having lightheadedness particularly with standing for about a week.  No syncopal episode.  Pressure is moderately elevated.  Patient describes blood pressure typically better controlled.  Her routine medications lisinopril 40 mg daily.  At this time diagnostic evaluation does not show any emergent condition.  Patient's neurologic exam is normal.  We will proceed with recommendation for careful blood pressure monitoring at home and as needed hydralazine with close follow-up with PCP.  Return precautions reviewed.  Also consideration given to persisting post-COVID symptoms.  Patient is recovering from COVID.  She however does not have any hypoxia, no signs of secondary pneumonia, no signs of encephalopathy. Final Clinical Impression(s) / ED Diagnoses Final diagnoses:  Light headedness  Essential hypertension  Post covid-19 condition, unspecified    Rx / DC Orders ED Discharge Orders          Ordered    hydrALAZINE (APRESOLINE) 25 MG tablet        11/26/20 1948             Arby Barrette, MD 11/26/20 1952

## 2020-11-26 NOTE — ED Triage Notes (Addendum)
Dizziness since Sat. States her vision is different. "Like a glow" No other symtpoms. Covid + May 17th. States she had covid  May 17-30th

## 2020-11-26 NOTE — Discharge Instructions (Addendum)
1.  Your symptoms may still be from recent recovery from COVID.  Try to continue to rest, hydrate and eat a healthy diet. 2.  Your blood pressure was elevated a medication is being added.  You are taking lisinopril 40 mg daily.  If your blood pressure remains greater than 150 systolic or 90 diastolic, taking 25 mg hydralazine tablet in keep a journal of your blood pressures.  You will need to review this with your family doctor.  If your blood pressures are persistently remaining elevated, you may need a second permanent blood pressure medication added to your regimen. 3.  Return to the emergency department if you have any worsening, changing or concerning symptoms develop.

## 2020-11-30 DIAGNOSIS — I10 Essential (primary) hypertension: Secondary | ICD-10-CM | POA: Diagnosis not present

## 2020-11-30 DIAGNOSIS — E1169 Type 2 diabetes mellitus with other specified complication: Secondary | ICD-10-CM | POA: Diagnosis not present

## 2020-11-30 DIAGNOSIS — R42 Dizziness and giddiness: Secondary | ICD-10-CM | POA: Diagnosis not present

## 2020-12-05 ENCOUNTER — Ambulatory Visit: Payer: Medicare HMO | Attending: Family Medicine | Admitting: Rehabilitative and Restorative Service Providers"

## 2020-12-05 ENCOUNTER — Other Ambulatory Visit: Payer: Self-pay

## 2020-12-05 ENCOUNTER — Encounter: Payer: Self-pay | Admitting: Rehabilitative and Restorative Service Providers"

## 2020-12-05 DIAGNOSIS — M6281 Muscle weakness (generalized): Secondary | ICD-10-CM

## 2020-12-05 DIAGNOSIS — R2681 Unsteadiness on feet: Secondary | ICD-10-CM

## 2020-12-05 DIAGNOSIS — R42 Dizziness and giddiness: Secondary | ICD-10-CM | POA: Diagnosis not present

## 2020-12-05 DIAGNOSIS — H8112 Benign paroxysmal vertigo, left ear: Secondary | ICD-10-CM

## 2020-12-05 NOTE — Therapy (Signed)
[facility] [address] [place], [place], [address] Phone: [phone]   Fax:  [phone]  Physical Therapy Evaluation  Patient Details  Name: [name] MRN: [id number] Date of Birth: Oct 28, 1940 Referring Provider (PT): Dr Daisy Floro   Encounter Date: 12/05/2020   PT End of Session - 12/05/20 1123     Visit Number 1    Date for PT Re-Evaluation 02/22/21    PT Start Time 1015    PT Stop Time 1100    PT Time Calculation (min) 45 min    Activity Tolerance Patient tolerated treatment well    Behavior During Therapy [facility] for tasks assessed/performed             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   Multiple drug allergies    PONV (postoperative nausea and vomiting)    Thoracic aorta atherosclerosis (HCC) 02/03/2018   Noted on CXR   TMJ (dislocation of temporomandibular joint)     Past Surgical History:  Procedure Laterality Date   ABDOMINAL HYSTERECTOMY  1986   APPENDECTOMY     age 22   CHOLECYSTECTOMY  2009   lap chole   COLONOSCOPY     LAPAROSCOPIC ABDOMINAL EXPLORATION N/A 05/19/2018   Procedure: LAPAROSCOPIC MEDIAN ARCUATE LIGAMENT RELEASE ERAS PATHWAY;  Surgeon: Rodman Pickle, MD;  Location: [facility];  Service: General;  Laterality: N/A;   TMJ ARTHROPLASTY  10/27/2011   Procedure: TEMPOROMANDIBULAR JOINT (TMJ) ARTHROPLASTY;  Surgeon: Georgia Lopes, DDS;  Location: [facility] OR;  Service: Oral Surgery;   Laterality: Right;  Right Temporomandibular Joint Arthrotomy, Meniscectomy, Arthroplasty   TONSILLECTOMY     TRANSFER / TRANSPLANT ANKLE TENDON SUPERFICIAL / DEEP  2013   rt ankle   UPPER GI ENDOSCOPY      There were no vitals filed for this visit.    Subjective Assessment - 12/05/20 1019     Subjective Pt reports that dizziness has been going on for a couple of weeks.  She reports that she can't lie flat or she will get dizzy.  She reports that head turns make her feel dizzy and sometimes nauseous, as well.  Pt reports that she had COVID the end of May and has had some sinus congestion.    Pertinent History COVID in May 2022.    Limitations House hold activities    Patient Stated Goals To be able to drive again without feeling like I might not be a safe driver.    Currently in Pain? No/denies                [facility] PT Assessment - 12/05/20 0001       Assessment   Medical Diagnosis Dizziness    Referring Provider (PT) Dr Daisy Floro    Hand Dominance Right    Next MD Visit July 2022      Precautions  Precautions Fall      Restrictions   Weight Bearing Restrictions No      Balance Screen   Has the patient fallen in the past 6 months No    Has the patient had a decrease in activity level because of a fear of falling?  No    Is the patient reluctant to leave their home because of a fear of falling?  No      Home Environment   Living Environment Private residence    Living Arrangements Spouse/significant other    Type of Home House    Home Access Stairs to enter    Entrance Stairs-Number of Steps 6    Entrance Stairs-Rails Left    Home Layout One level   has a finished basement, but rarely uses     Prior Function   Level of Independence Independent    Vocation Retired    Leisure Jigsaw puzzles, reading      Cognition   Overall Cognitive Status Impaired/Different from baseline   Pt reports that she has some brain fog as a result of COVID     ROM /  Strength   AROM / PROM / Strength AROM;Strength      AROM   Overall AROM  Within functional limits for tasks performed      Strength   Overall Strength Deficits    Overall Strength Comments B shoulder and hip flexion strength of 4/5      Standardized Balance Assessment   Standardized Balance Assessment Dynamic Gait Index      Dynamic Gait Index   Level Surface Mild Impairment    Change in Gait Speed Mild Impairment    Gait with Horizontal Head Turns Mild Impairment    Gait with Vertical Head Turns Mild Impairment    Gait and Pivot Turn Moderate Impairment    Step Over Obstacle Mild Impairment    Step Around Obstacles Mild Impairment    Steps Mild Impairment    Total Score 15                    Vestibular Assessment - 12/05/20 0001       Symptom Behavior   Subjective history of current problem Pt reports that dizziness has been going on for a couple of weeks without relief.    Type of Dizziness  Unsteady with head/body turns;Imbalance    Frequency of Dizziness With movement, such as walking around or lying flat    Duration of Dizziness Pt reports that dizziness stops when she sits down and stays still    Symptom Nature Motion provoked;Positional    Aggravating Factors Activity in general;Lying supine;Driving;Walking in a crowd    Relieving Factors Head stationary   seated position   Progression of Symptoms No change since onset    History of similar episodes Pt states that she may have had some dizziness before, but she cannot recall details.      Oculomotor Exam   Spontaneous Absent    Gaze-induced  Absent    Head shaking Horizontal L beating nystagmus   positive with head thrust to L side   Smooth Pursuits Intact    Saccades Intact      Vestibulo-Ocular Reflex   VOR 1 Head Only (x 1 viewing) Dizziness reported                Objective measurements completed on examination: See above findings.  PT Education - 12/05/20 1105      Education Details Pt provided with HEP and BPPV handout    Person(s) Educated Patient    Methods Explanation;Demonstration;Handout    Comprehension Verbalized understanding              PT Short Term Goals - 12/05/20 1209       PT SHORT TERM GOAL #1   Title Pt will be independent with initial HEP.    Time 2    Period Weeks    Status New               PT Long Term Goals - 12/05/20 1209       PT LONG TERM GOAL #1   Title Pt will be independent with advanced HEP.    Time 12    Period Weeks    Status New      PT LONG TERM GOAL #2   Title Patient will report an 80% improvement in dizziness symptoms.    Time 12    Period Weeks    Status New      PT LONG TERM GOAL #3   Title Patient will increase DGI to at least 20/24 to decrease her risk of fallng.    Time 12    Period Weeks    Status New      PT LONG TERM GOAL #4   Title Patient will increase shoulder and hip strength to at least 4+/5 to allow her to perform functional mobility.    Time 12    Period Weeks    Status New                    Plan - 12/05/20 1147     Clinical Impression Statement Patient presents with a referral from Dr Daisy Floro for skilled PT for a vestibular evaluation secondary to dizziness.  She was diagnosed with Covid at the end of May and approximately 2 weeks ago, she began having some dizziness that has not resolved.  Pt presents with bilateral shoulder and hip weakness, possibly secondary to covid weakness. She presents with positive head thrust to L with upbeating nystagmus as well as dizziness with ambulation with a DGI of 15/24.  She presents with positive bilateral Dix-Hallpike, but more positive on the L than the R with only dizziness and mild nystagmus on the R side, but L side presents with noticable upbeat nystagmus noted.  Proceeded with treatment of both R and L side with Epley maneuver for Canalith Repositioning.  Pt presents with general vestibular  hypofunctioning, as well.  Pt would benefit from skilled outpatient PT to progress with improved balance and decreased dizziness.    Personal Factors and Comorbidities Age;Comorbidity 3+    Comorbidities covid, DM, HTN    Examination-Activity Limitations Locomotion Level    Examination-Participation Restrictions Driving    Stability/Clinical Decision Making Evolving/Moderate complexity    Clinical Decision Making Moderate    Rehab Potential Good    PT Frequency 2x / week    PT Duration 12 weeks    PT Treatment/Interventions ADLs/Self Care Home Management;Canalith Repostioning;Cryotherapy;Electrical Stimulation;Iontophoresis 4mg /ml Dexamethasone;Moist Heat;Traction;Ultrasound;Gait training;Stair training;Functional mobility training;Therapeutic activities;Therapeutic exercise;Balance training;Neuromuscular re-education;Patient/family education;Manual techniques;Passive range of motion;Dry needling;Energy conservation;Taping;Vestibular    PT Next Visit Plan Retest Dix-Hallpike with Epley, if needed.  Vestibular progression, strengthening    PT Home Exercise Plan Access Code:    Consulted and Agree with Plan of Care Patient  Patient will benefit from skilled therapeutic intervention in order to improve the following deficits and impairments:  Difficulty walking, Dizziness, Decreased activity tolerance, Decreased balance, Decreased strength, Postural dysfunction  Visit Diagnosis: Dizziness and giddiness - Plan: PT plan of care cert/re-cert  Unsteadiness on feet - Plan: PT plan of care cert/re-cert  Muscle weakness (generalized) - Plan: PT plan of care cert/re-cert  BPPV (benign paroxysmal positional vertigo), left - Plan: PT plan of care cert/re-cert     Problem List Patient Active Problem List   Diagnosis Date Noted   Median arcuate ligament syndrome (HCC) 05/19/2018    Reather LaurenceShaunda Lennyn Bellanca, PT, DPT 12/05/2020, 12:14 PM  [facility] [address] [place], [place], [id number] Phone: [phone]   Fax:  [phone]  Name: Amanda KapurSally Medina MRN: [id number] Date of Birth: 1941-02-17

## 2020-12-05 NOTE — Patient Instructions (Signed)
Access Code: [email or web address] URL: [email or web address] Date: 12/05/2020 Prepared by: Reather Laurence  Exercises Brandt-Daroff Vestibular Exercise - 2 x daily - 7 x weekly - 1 sets - 5 reps Seated Gaze Stabilization with Head Rotation - 2 x daily - 7 x weekly - 1 sets - 5 reps - 1 min hold  Patient Education BPPV

## 2020-12-11 ENCOUNTER — Encounter: Payer: Self-pay | Admitting: Physical Therapy

## 2020-12-11 ENCOUNTER — Other Ambulatory Visit: Payer: Self-pay

## 2020-12-11 ENCOUNTER — Ambulatory Visit: Payer: Medicare HMO | Admitting: Physical Therapy

## 2020-12-11 ENCOUNTER — Ambulatory Visit: Payer: Medicare HMO

## 2020-12-11 DIAGNOSIS — R2681 Unsteadiness on feet: Secondary | ICD-10-CM | POA: Diagnosis not present

## 2020-12-11 DIAGNOSIS — M6281 Muscle weakness (generalized): Secondary | ICD-10-CM

## 2020-12-11 DIAGNOSIS — R42 Dizziness and giddiness: Secondary | ICD-10-CM

## 2020-12-11 DIAGNOSIS — H8112 Benign paroxysmal vertigo, left ear: Secondary | ICD-10-CM | POA: Diagnosis not present

## 2020-12-11 NOTE — Therapy (Signed)
[facility] [address] [place], [place], [address] Phone: [phone]   Fax:  [phone]  Physical Therapy Treatment  Patient Details  Name: [name] MRN: [id number] Date of Birth: 1940-12-07 Referring Provider (PT): Dr Daisy Floro   Encounter Date: 12/11/2020   PT End of Session - 12/11/20 1427     Visit Number 2    Date for PT Re-Evaluation 02/22/21    PT Start Time 1345    PT Stop Time 1424    PT Time Calculation (min) 39 min    Activity Tolerance Patient tolerated treatment well    Behavior During Therapy [facility] for tasks assessed/performed             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   Multiple drug allergies    PONV (postoperative nausea and vomiting)    Thoracic aorta atherosclerosis (HCC) 02/03/2018   Noted on CXR   TMJ (dislocation of temporomandibular joint)     Past Surgical History:  Procedure Laterality Date   ABDOMINAL HYSTERECTOMY  1986   APPENDECTOMY     age 89   CHOLECYSTECTOMY  2009   lap chole   COLONOSCOPY     LAPAROSCOPIC ABDOMINAL EXPLORATION N/A 05/19/2018   Procedure: LAPAROSCOPIC MEDIAN ARCUATE LIGAMENT RELEASE ERAS PATHWAY;  Surgeon: Rodman Pickle, MD;  Location: [facility];  Service: General;  Laterality: N/A;   TMJ ARTHROPLASTY  10/27/2011   Procedure: TEMPOROMANDIBULAR JOINT (TMJ) ARTHROPLASTY;  Surgeon: Georgia Lopes, DDS;  Location: [facility];  Service: Oral Surgery;   Laterality: Right;  Right Temporomandibular Joint Arthrotomy, Meniscectomy, Arthroplasty   TONSILLECTOMY     TRANSFER / TRANSPLANT ANKLE TENDON SUPERFICIAL / DEEP  2013   rt ankle   UPPER GI ENDOSCOPY      There were no vitals filed for this visit.   Subjective Assessment - 12/11/20 1348     Subjective Pt states that dizziness overall has been better; still having dizziness when she rolls over or gets up from a lying down position. States that ex's are going well and make her less dizzy than before.    Currently in Pain? No/denies                     Vestibular Assessment - 12/11/20 0001       Positional Testing   Dix-Hallpike Dix-Hallpike Right;Dix-Hallpike Left      Dix-Hallpike Right   Dix-Hallpike Right Duration negative, no reports of dizziness, no nystagmus      Dix-Hallpike Left   Dix-Hallpike Left Duration 5-6 seconds    Dix-Hallpike Left Symptoms --   upbeating, torsional nystagmus with reports of dizziness + mild nausea. no symptoms on 3rd test     Positional Sensitivities   Supine to Sitting Mild dizziness    Up from Left Hallpike Mild dizziness  Vestibular Treatment/Exercise - 12/11/20 0001       Vestibular Treatment/Exercise   Vestibular Treatment Provided Canalith Repositioning    Canalith Repositioning Epley Manuever Left       EPLEY MANUEVER LEFT   Number of Reps  2    Overall Response  Improved Symptoms     RESPONSE DETAILS LEFT decreased severity of symptoms 2nd rep; still some residual dizziness remaining                     PT Short Term Goals - 12/11/20 1430       PT SHORT TERM GOAL #1   Title Pt will be independent with initial HEP.    Time 2    Period Weeks    Status Achieved               PT Long Term Goals - 12/05/20 1209       PT LONG TERM GOAL #1   Title Pt will be independent with advanced HEP.    Time 12    Period Weeks    Status New      PT LONG TERM GOAL #2    Title Patient will report an 80% improvement in dizziness symptoms.    Time 12    Period Weeks    Status New      PT LONG TERM GOAL #3   Title Patient will increase DGI to at least 20/24 to decrease her risk of fallng.    Time 12    Period Weeks    Status New      PT LONG TERM GOAL #4   Title Patient will increase shoulder and hip strength to at least 4+/5 to allow her to perform functional mobility.    Time 12    Period Weeks    Status New                   Plan - 12/11/20 1428     Clinical Impression Statement Pt demos negative Dix-Hallpike on R side, positive on L side with ~5-6 seconds of upbeating torsional nystagmus and reports of mild nausea. L Epley maneuver x2 with improved symptoms post. Negative L Dix-Hallpike on third test; did still have mild dizziness rising to sitting from supine. Educated on progression of VOR ex's and importance of continuation of Brandt-Daroff ex's. Plan to recheck canals next rx and treat as indicated.    PT Treatment/Interventions ADLs/Self Care Home Management;Canalith Repostioning;Cryotherapy;Electrical Stimulation;Iontophoresis 4mg /ml Dexamethasone;Moist Heat;Traction;Ultrasound;Gait training;Stair training;Functional mobility training;Therapeutic activities;Therapeutic exercise;Balance training;Neuromuscular re-education;Patient/family education;Manual techniques;Passive range of motion;Dry needling;Energy conservation;Taping;Vestibular    PT Next Visit Plan Retest Dix-Hallpike with Epley, if needed.  Vestibular progression, strengthening    Consulted and Agree with Plan of Care Patient             Patient will benefit from skilled therapeutic intervention in order to improve the following deficits and impairments:  Difficulty walking, Dizziness, Decreased activity tolerance, Decreased balance, Decreased strength, Postural dysfunction  Visit Diagnosis: Dizziness and giddiness  Muscle weakness (generalized)  Unsteadiness on  feet  BPPV (benign paroxysmal positional vertigo), left     Problem List Patient Active Problem List   Diagnosis Date Noted   Median arcuate ligament syndrome (HCC) 05/19/2018   14/09/2017, PT, DPT Lysle Rubens Lucyann Romano 12/11/2020, 2:31 PM  [facility] [address] [place], [place], [place] Phone: [phone]   Fax:  [phone]  Name: Lizbett Garciagarcia MRN: Lilian Kapur Date  of Birth: [birth year]

## 2020-12-13 ENCOUNTER — Other Ambulatory Visit: Payer: Self-pay

## 2020-12-13 ENCOUNTER — Ambulatory Visit: Payer: Medicare HMO | Admitting: Rehabilitative and Restorative Service Providers"

## 2020-12-13 ENCOUNTER — Encounter: Payer: Self-pay | Admitting: Rehabilitative and Restorative Service Providers"

## 2020-12-13 DIAGNOSIS — M6281 Muscle weakness (generalized): Secondary | ICD-10-CM

## 2020-12-13 DIAGNOSIS — R2681 Unsteadiness on feet: Secondary | ICD-10-CM | POA: Diagnosis not present

## 2020-12-13 DIAGNOSIS — H8112 Benign paroxysmal vertigo, left ear: Secondary | ICD-10-CM

## 2020-12-13 DIAGNOSIS — R42 Dizziness and giddiness: Secondary | ICD-10-CM

## 2020-12-13 NOTE — Therapy (Signed)
[place] [place], [place], [address] Phone: [phone]   Fax:  [phone]  Physical Therapy Treatment  Patient Details  Name: [name] MRN: [id number] Date of Birth: Dec 08, 1940 Referring Provider (PT): Dr Lawerance Cruel   Encounter Date: 12/13/2020   PT End of Session - 12/13/20 0803     Visit Number 3    Date for PT Re-Evaluation 02/22/21    PT Start Time 0800    PT Stop Time 0840    PT Time Calculation (min) 40 min    Activity Tolerance Patient tolerated treatment well    Behavior During Therapy [facility] for tasks assessed/performed             Past Medical History:  Diagnosis Date   Diabetes mellitus without complication ([place])    diet controlled   GERD (gastroesophageal reflux disease)    History of appendicitis    childhood   History of gallstones    History of gastric polyp    History of gastritis    History of hiatal hernia 02/25/2018   Small, noted on CT Angio Abd/pelvis   History of migraine    History of tonsillitis    childhood   Hypertension    Median arcuate ligament syndrome ([place])    high grade stenosis celiac artery, 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   Multiple drug allergies    PONV (postoperative nausea and vomiting)    Thoracic aorta atherosclerosis ([place]) 02/03/2018   Noted on CXR   TMJ (dislocation of temporomandibular joint)     Past Surgical History:  Procedure Laterality Date   ABDOMINAL HYSTERECTOMY  1986   APPENDECTOMY     age 64   CHOLECYSTECTOMY  2009   lap chole   COLONOSCOPY     LAPAROSCOPIC ABDOMINAL EXPLORATION N/A 05/19/2018   Procedure: LAPAROSCOPIC MEDIAN ARCUATE LIGAMENT RELEASE ERAS PATHWAY;  Surgeon: Mickeal Skinner, MD;  Location: [facility];  Service: General;  Laterality: N/A;   TMJ ARTHROPLASTY  10/27/2011   Procedure: TEMPOROMANDIBULAR JOINT (TMJ) ARTHROPLASTY;  Surgeon: Gae Bon, DDS;  Location: [facility];  Service: Oral Surgery;   Laterality: Right;  Right Temporomandibular Joint Arthrotomy, Meniscectomy, Arthroplasty   TONSILLECTOMY     TRANSFER / TRANSPLANT ANKLE TENDON SUPERFICIAL / DEEP  2013   rt ankle   UPPER GI ENDOSCOPY      There were no vitals filed for this visit.   Subjective Assessment - 12/13/20 0802     Subjective Pt reports that she is still getting dizzy when she bends over or changes directions quickly, but overall she is feeling better and less dizzy.    Pertinent History COVID in May 2022.    Currently in Pain? No/denies                     Vestibular Assessment - 12/13/20 0001       Positional Testing   Dix-Hallpike Dix-Hallpike Left      Dix-Hallpike Left   Dix-Hallpike Left Duration 2 sec    Dix-Hallpike Left Symptoms Upbeat, left rotatory nystagmus   performed x2 reps, no dizziness on second testing                     [facility] Adult PT Treatment/Exercise - 12/13/20 0001       High Level Balance   High Level Balance Activities Side stepping;Backward walking;Direction changes;Turns;Sudden stops;Head turns;Tandem walking    High Level Balance Comments  Outside in parking lot working on walking lines and dynamic gait activities             Vestibular Treatment/Exercise - 12/13/20 0001       Vestibular Treatment/Exercise   Vestibular Treatment Provided Canalith Repositioning    Canalith Repositioning Epley Manuever Left    Habituation Exercises Brandt Daroff;Seated Vertical Head Turns;Seated Horizontal Head Turns;360 degree Turns;Comment   Bending down to pick up cones and place back on table and back down (x15).  Then bending down for prolonged time to stack 5 cones on floor together and separate back apart (x2 reps).      EPLEY MANUEVER LEFT   Number of Reps  2    Overall Response  Symptoms Resolved     RESPONSE DETAILS LEFT No dizziness or nystagmus noted on second repetition of Epley      Nestor Lewandowsky   Number of Reps  3    Symptom Description   pt reports minor dizziness to R side, but none to L      Seated Horizontal Head Turns   Number of Reps  20    Symptom Description  denies dizziness      Seated Vertical Head Turns   Number of Reps  20    Symptom Description  denies dizziness      360 degree Turns   Number of Reps  5    Symptom Description  Pt has more dizziness with 5 turns to the left, than to the R.  Pt with more excursion off initial point with turns to the L.    COMMENT 5 turns to R, then 5 turns to L                     PT Short Term Goals - 12/11/20 1430       PT SHORT TERM GOAL #1   Title Pt will be independent with initial HEP.    Time 2    Period Weeks    Status Achieved               PT Long Term Goals - 12/13/20 0916       PT LONG TERM GOAL #1   Title Pt will be independent with advanced HEP.    Status Partially Met      PT LONG TERM GOAL #2   Title Patient will report an 80% improvement in dizziness symptoms.    Status Partially Met      PT LONG TERM GOAL #3   Title Patient will increase DGI to at least 20/24 to decrease her risk of fallng.    Status On-going      PT LONG TERM GOAL #4   Title Patient will increase shoulder and hip strength to at least 4+/5 to allow her to perform functional mobility.    Status On-going                   Plan - 12/13/20 7253     Clinical Impression Statement Pt continues to have slightly positive L sided Dix-Hallpike nystagmus noted, but lasted less time than last session.  Pt demonstrates good technique with Nestor Lewandowsky and VOR 1x1.  She did have some excursion noted with 5 consecutive 360 degree turns to left side, but able to maintain balance independently and dizziness from this resolved quickly.  Patient continues to require at least one additional PT session, but she is hoping to DC after next visit if her  symptoms continue to resolve.    PT Treatment/Interventions ADLs/Self Care Home Management;Canalith  Repostioning;Cryotherapy;Electrical Stimulation;Iontophoresis 4m/ml Dexamethasone;Moist Heat;Traction;Ultrasound;Gait training;Stair training;Functional mobility training;Therapeutic activities;Therapeutic exercise;Balance training;Neuromuscular re-education;Patient/family education;Manual techniques;Passive range of motion;Dry needling;Energy conservation;Taping;Vestibular    PT Next Visit Plan Retest Dix-Hallpike with Epley, if needed.  Vestibular progression, strengthening    Consulted and Agree with Plan of Care Patient             Patient will benefit from skilled therapeutic intervention in order to improve the following deficits and impairments:  Difficulty walking, Dizziness, Decreased activity tolerance, Decreased balance, Decreased strength, Postural dysfunction  Visit Diagnosis: Dizziness and giddiness  Muscle weakness (generalized)  Unsteadiness on feet  BPPV (benign paroxysmal positional vertigo), left     Problem List Patient Active Problem List   Diagnosis Date Noted   [place] arcuate ligament syndrome ([place] 05/19/2018    SJuel Burrow PT, DPT 12/13/2020, 9:18 AM  [place] [address]Phone: [phone]  Fax:  [phone] Name: SEreka BrauMRN: [id number] of Birth: [birth year]

## 2020-12-21 ENCOUNTER — Ambulatory Visit: Payer: Medicare HMO | Admitting: Rehabilitative and Restorative Service Providers"

## 2020-12-26 DIAGNOSIS — E782 Mixed hyperlipidemia: Secondary | ICD-10-CM | POA: Diagnosis not present

## 2020-12-26 DIAGNOSIS — I10 Essential (primary) hypertension: Secondary | ICD-10-CM | POA: Diagnosis not present

## 2020-12-26 DIAGNOSIS — E559 Vitamin D deficiency, unspecified: Secondary | ICD-10-CM | POA: Diagnosis not present

## 2020-12-26 DIAGNOSIS — D696 Thrombocytopenia, unspecified: Secondary | ICD-10-CM | POA: Diagnosis not present

## 2020-12-26 DIAGNOSIS — E1169 Type 2 diabetes mellitus with other specified complication: Secondary | ICD-10-CM | POA: Diagnosis not present

## 2020-12-26 DIAGNOSIS — R42 Dizziness and giddiness: Secondary | ICD-10-CM | POA: Diagnosis not present

## 2021-01-03 DIAGNOSIS — J019 Acute sinusitis, unspecified: Secondary | ICD-10-CM | POA: Diagnosis not present

## 2021-02-06 DIAGNOSIS — J0140 Acute pansinusitis, unspecified: Secondary | ICD-10-CM | POA: Diagnosis not present

## 2021-03-05 DIAGNOSIS — J0140 Acute pansinusitis, unspecified: Secondary | ICD-10-CM | POA: Diagnosis not present

## 2021-03-05 DIAGNOSIS — Z87891 Personal history of nicotine dependence: Secondary | ICD-10-CM | POA: Diagnosis not present

## 2021-03-05 DIAGNOSIS — H938X3 Other specified disorders of ear, bilateral: Secondary | ICD-10-CM | POA: Diagnosis not present

## 2021-05-21 DIAGNOSIS — E119 Type 2 diabetes mellitus without complications: Secondary | ICD-10-CM | POA: Diagnosis not present

## 2021-05-21 DIAGNOSIS — H5213 Myopia, bilateral: Secondary | ICD-10-CM | POA: Diagnosis not present

## 2021-05-21 DIAGNOSIS — H2513 Age-related nuclear cataract, bilateral: Secondary | ICD-10-CM | POA: Diagnosis not present

## 2021-05-28 DIAGNOSIS — D696 Thrombocytopenia, unspecified: Secondary | ICD-10-CM | POA: Diagnosis not present

## 2021-05-28 DIAGNOSIS — E1169 Type 2 diabetes mellitus with other specified complication: Secondary | ICD-10-CM | POA: Diagnosis not present

## 2021-05-28 DIAGNOSIS — I10 Essential (primary) hypertension: Secondary | ICD-10-CM | POA: Diagnosis not present

## 2021-05-28 DIAGNOSIS — E559 Vitamin D deficiency, unspecified: Secondary | ICD-10-CM | POA: Diagnosis not present

## 2021-05-28 DIAGNOSIS — E782 Mixed hyperlipidemia: Secondary | ICD-10-CM | POA: Diagnosis not present

## 2021-06-03 DIAGNOSIS — Z1231 Encounter for screening mammogram for malignant neoplasm of breast: Secondary | ICD-10-CM | POA: Diagnosis not present

## 2021-06-04 DIAGNOSIS — Z0000 Encounter for general adult medical examination without abnormal findings: Secondary | ICD-10-CM | POA: Diagnosis not present

## 2021-06-04 DIAGNOSIS — E559 Vitamin D deficiency, unspecified: Secondary | ICD-10-CM | POA: Diagnosis not present

## 2021-06-04 DIAGNOSIS — E1169 Type 2 diabetes mellitus with other specified complication: Secondary | ICD-10-CM | POA: Diagnosis not present

## 2021-06-04 DIAGNOSIS — I10 Essential (primary) hypertension: Secondary | ICD-10-CM | POA: Diagnosis not present

## 2021-06-04 DIAGNOSIS — E782 Mixed hyperlipidemia: Secondary | ICD-10-CM | POA: Diagnosis not present

## 2021-06-04 DIAGNOSIS — D696 Thrombocytopenia, unspecified: Secondary | ICD-10-CM | POA: Diagnosis not present

## 2021-08-29 DIAGNOSIS — I10 Essential (primary) hypertension: Secondary | ICD-10-CM | POA: Diagnosis not present

## 2021-08-29 DIAGNOSIS — M25552 Pain in left hip: Secondary | ICD-10-CM | POA: Diagnosis not present

## 2021-08-29 DIAGNOSIS — L237 Allergic contact dermatitis due to plants, except food: Secondary | ICD-10-CM | POA: Diagnosis not present

## 2021-08-29 DIAGNOSIS — R21 Rash and other nonspecific skin eruption: Secondary | ICD-10-CM | POA: Diagnosis not present

## 2021-08-29 DIAGNOSIS — M1612 Unilateral primary osteoarthritis, left hip: Secondary | ICD-10-CM | POA: Diagnosis not present

## 2021-10-09 DIAGNOSIS — I10 Essential (primary) hypertension: Secondary | ICD-10-CM | POA: Diagnosis not present

## 2021-10-09 DIAGNOSIS — M25552 Pain in left hip: Secondary | ICD-10-CM | POA: Diagnosis not present

## 2021-10-11 DIAGNOSIS — M25552 Pain in left hip: Secondary | ICD-10-CM | POA: Diagnosis not present

## 2021-10-15 DIAGNOSIS — M1612 Unilateral primary osteoarthritis, left hip: Secondary | ICD-10-CM | POA: Diagnosis not present

## 2021-11-12 DIAGNOSIS — M25552 Pain in left hip: Secondary | ICD-10-CM | POA: Diagnosis not present

## 2022-02-26 DIAGNOSIS — Z23 Encounter for immunization: Secondary | ICD-10-CM | POA: Diagnosis not present

## 2022-03-14 DIAGNOSIS — Z87891 Personal history of nicotine dependence: Secondary | ICD-10-CM | POA: Diagnosis not present

## 2022-03-14 DIAGNOSIS — Z880 Allergy status to penicillin: Secondary | ICD-10-CM | POA: Diagnosis not present

## 2022-03-14 DIAGNOSIS — I10 Essential (primary) hypertension: Secondary | ICD-10-CM | POA: Diagnosis not present

## 2022-03-14 DIAGNOSIS — Z809 Family history of malignant neoplasm, unspecified: Secondary | ICD-10-CM | POA: Diagnosis not present

## 2022-03-14 DIAGNOSIS — M1990 Unspecified osteoarthritis, unspecified site: Secondary | ICD-10-CM | POA: Diagnosis not present

## 2022-03-14 DIAGNOSIS — E119 Type 2 diabetes mellitus without complications: Secondary | ICD-10-CM | POA: Diagnosis not present

## 2022-03-14 DIAGNOSIS — Z8249 Family history of ischemic heart disease and other diseases of the circulatory system: Secondary | ICD-10-CM | POA: Diagnosis not present

## 2022-03-14 DIAGNOSIS — K219 Gastro-esophageal reflux disease without esophagitis: Secondary | ICD-10-CM | POA: Diagnosis not present

## 2022-03-14 DIAGNOSIS — Z87892 Personal history of anaphylaxis: Secondary | ICD-10-CM | POA: Diagnosis not present

## 2022-04-09 DIAGNOSIS — E1169 Type 2 diabetes mellitus with other specified complication: Secondary | ICD-10-CM | POA: Diagnosis not present

## 2022-04-09 DIAGNOSIS — E782 Mixed hyperlipidemia: Secondary | ICD-10-CM | POA: Diagnosis not present

## 2022-04-09 DIAGNOSIS — I10 Essential (primary) hypertension: Secondary | ICD-10-CM | POA: Diagnosis not present

## 2022-04-09 DIAGNOSIS — D696 Thrombocytopenia, unspecified: Secondary | ICD-10-CM | POA: Diagnosis not present

## 2022-04-09 DIAGNOSIS — E559 Vitamin D deficiency, unspecified: Secondary | ICD-10-CM | POA: Diagnosis not present

## 2022-04-16 DIAGNOSIS — Z6826 Body mass index (BMI) 26.0-26.9, adult: Secondary | ICD-10-CM | POA: Diagnosis not present

## 2022-04-16 DIAGNOSIS — D696 Thrombocytopenia, unspecified: Secondary | ICD-10-CM | POA: Diagnosis not present

## 2022-04-16 DIAGNOSIS — E1169 Type 2 diabetes mellitus with other specified complication: Secondary | ICD-10-CM | POA: Diagnosis not present

## 2022-04-16 DIAGNOSIS — E559 Vitamin D deficiency, unspecified: Secondary | ICD-10-CM | POA: Diagnosis not present

## 2022-04-16 DIAGNOSIS — E782 Mixed hyperlipidemia: Secondary | ICD-10-CM | POA: Diagnosis not present

## 2022-04-16 DIAGNOSIS — Z0000 Encounter for general adult medical examination without abnormal findings: Secondary | ICD-10-CM | POA: Diagnosis not present

## 2022-04-16 DIAGNOSIS — K219 Gastro-esophageal reflux disease without esophagitis: Secondary | ICD-10-CM | POA: Diagnosis not present

## 2022-04-16 DIAGNOSIS — I700 Atherosclerosis of aorta: Secondary | ICD-10-CM | POA: Diagnosis not present

## 2022-04-16 DIAGNOSIS — M8588 Other specified disorders of bone density and structure, other site: Secondary | ICD-10-CM | POA: Diagnosis not present

## 2022-04-16 DIAGNOSIS — G720 Drug-induced myopathy: Secondary | ICD-10-CM | POA: Diagnosis not present

## 2022-04-16 DIAGNOSIS — I10 Essential (primary) hypertension: Secondary | ICD-10-CM | POA: Diagnosis not present

## 2022-04-16 DIAGNOSIS — R059 Cough, unspecified: Secondary | ICD-10-CM | POA: Diagnosis not present

## 2022-05-27 DIAGNOSIS — H5213 Myopia, bilateral: Secondary | ICD-10-CM | POA: Diagnosis not present

## 2022-05-27 DIAGNOSIS — E119 Type 2 diabetes mellitus without complications: Secondary | ICD-10-CM | POA: Diagnosis not present

## 2022-05-27 DIAGNOSIS — H2513 Age-related nuclear cataract, bilateral: Secondary | ICD-10-CM | POA: Diagnosis not present

## 2022-05-27 DIAGNOSIS — H18593 Other hereditary corneal dystrophies, bilateral: Secondary | ICD-10-CM | POA: Diagnosis not present

## 2022-05-27 DIAGNOSIS — H524 Presbyopia: Secondary | ICD-10-CM | POA: Diagnosis not present

## 2022-06-05 DIAGNOSIS — Z1231 Encounter for screening mammogram for malignant neoplasm of breast: Secondary | ICD-10-CM | POA: Diagnosis not present

## 2022-06-11 DIAGNOSIS — Z6827 Body mass index (BMI) 27.0-27.9, adult: Secondary | ICD-10-CM | POA: Diagnosis not present

## 2022-06-11 DIAGNOSIS — Z0000 Encounter for general adult medical examination without abnormal findings: Secondary | ICD-10-CM | POA: Diagnosis not present

## 2022-06-11 DIAGNOSIS — Z23 Encounter for immunization: Secondary | ICD-10-CM | POA: Diagnosis not present

## 2022-08-26 DIAGNOSIS — H9319 Tinnitus, unspecified ear: Secondary | ICD-10-CM | POA: Diagnosis not present

## 2022-08-26 DIAGNOSIS — I10 Essential (primary) hypertension: Secondary | ICD-10-CM | POA: Diagnosis not present

## 2022-08-26 DIAGNOSIS — Z6827 Body mass index (BMI) 27.0-27.9, adult: Secondary | ICD-10-CM | POA: Diagnosis not present

## 2022-09-24 DIAGNOSIS — M25552 Pain in left hip: Secondary | ICD-10-CM | POA: Diagnosis not present

## 2022-10-16 DIAGNOSIS — E119 Type 2 diabetes mellitus without complications: Secondary | ICD-10-CM | POA: Diagnosis not present

## 2022-10-16 DIAGNOSIS — E1169 Type 2 diabetes mellitus with other specified complication: Secondary | ICD-10-CM | POA: Diagnosis not present

## 2022-10-16 DIAGNOSIS — Z6827 Body mass index (BMI) 27.0-27.9, adult: Secondary | ICD-10-CM | POA: Diagnosis not present

## 2022-10-16 DIAGNOSIS — I700 Atherosclerosis of aorta: Secondary | ICD-10-CM | POA: Diagnosis not present

## 2022-10-16 DIAGNOSIS — L237 Allergic contact dermatitis due to plants, except food: Secondary | ICD-10-CM | POA: Diagnosis not present

## 2022-10-16 DIAGNOSIS — D696 Thrombocytopenia, unspecified: Secondary | ICD-10-CM | POA: Diagnosis not present

## 2022-10-16 DIAGNOSIS — G720 Drug-induced myopathy: Secondary | ICD-10-CM | POA: Diagnosis not present

## 2022-11-04 DIAGNOSIS — Z87891 Personal history of nicotine dependence: Secondary | ICD-10-CM | POA: Diagnosis not present

## 2022-11-04 DIAGNOSIS — K219 Gastro-esophageal reflux disease without esophagitis: Secondary | ICD-10-CM | POA: Diagnosis not present

## 2022-11-04 DIAGNOSIS — M1990 Unspecified osteoarthritis, unspecified site: Secondary | ICD-10-CM | POA: Diagnosis not present

## 2022-11-04 DIAGNOSIS — Z809 Family history of malignant neoplasm, unspecified: Secondary | ICD-10-CM | POA: Diagnosis not present

## 2022-11-04 DIAGNOSIS — M8580 Other specified disorders of bone density and structure, unspecified site: Secondary | ICD-10-CM | POA: Diagnosis not present

## 2022-11-04 DIAGNOSIS — E785 Hyperlipidemia, unspecified: Secondary | ICD-10-CM | POA: Diagnosis not present

## 2022-11-04 DIAGNOSIS — Z008 Encounter for other general examination: Secondary | ICD-10-CM | POA: Diagnosis not present

## 2022-11-04 DIAGNOSIS — R32 Unspecified urinary incontinence: Secondary | ICD-10-CM | POA: Diagnosis not present

## 2022-11-04 DIAGNOSIS — I10 Essential (primary) hypertension: Secondary | ICD-10-CM | POA: Diagnosis not present

## 2022-11-04 DIAGNOSIS — Z880 Allergy status to penicillin: Secondary | ICD-10-CM | POA: Diagnosis not present

## 2023-06-02 DIAGNOSIS — H5213 Myopia, bilateral: Secondary | ICD-10-CM | POA: Diagnosis not present

## 2023-06-02 DIAGNOSIS — H2513 Age-related nuclear cataract, bilateral: Secondary | ICD-10-CM | POA: Diagnosis not present

## 2023-06-02 DIAGNOSIS — H43812 Vitreous degeneration, left eye: Secondary | ICD-10-CM | POA: Diagnosis not present

## 2023-06-02 DIAGNOSIS — E119 Type 2 diabetes mellitus without complications: Secondary | ICD-10-CM | POA: Diagnosis not present

## 2023-06-02 DIAGNOSIS — H18593 Other hereditary corneal dystrophies, bilateral: Secondary | ICD-10-CM | POA: Diagnosis not present

## 2023-06-11 DIAGNOSIS — Z1231 Encounter for screening mammogram for malignant neoplasm of breast: Secondary | ICD-10-CM | POA: Diagnosis not present

## 2023-06-23 DIAGNOSIS — Z0000 Encounter for general adult medical examination without abnormal findings: Secondary | ICD-10-CM | POA: Diagnosis not present

## 2023-06-23 DIAGNOSIS — Z1331 Encounter for screening for depression: Secondary | ICD-10-CM | POA: Diagnosis not present

## 2023-06-25 DIAGNOSIS — E119 Type 2 diabetes mellitus without complications: Secondary | ICD-10-CM | POA: Diagnosis not present

## 2023-06-25 DIAGNOSIS — Z0000 Encounter for general adult medical examination without abnormal findings: Secondary | ICD-10-CM | POA: Diagnosis not present

## 2023-06-25 DIAGNOSIS — E782 Mixed hyperlipidemia: Secondary | ICD-10-CM | POA: Diagnosis not present

## 2023-06-25 DIAGNOSIS — I10 Essential (primary) hypertension: Secondary | ICD-10-CM | POA: Diagnosis not present

## 2023-06-25 DIAGNOSIS — G720 Drug-induced myopathy: Secondary | ICD-10-CM | POA: Diagnosis not present

## 2023-06-25 DIAGNOSIS — E559 Vitamin D deficiency, unspecified: Secondary | ICD-10-CM | POA: Diagnosis not present

## 2023-06-30 DIAGNOSIS — E1169 Type 2 diabetes mellitus with other specified complication: Secondary | ICD-10-CM | POA: Diagnosis not present

## 2023-06-30 DIAGNOSIS — I10 Essential (primary) hypertension: Secondary | ICD-10-CM | POA: Diagnosis not present

## 2023-06-30 DIAGNOSIS — E782 Mixed hyperlipidemia: Secondary | ICD-10-CM | POA: Diagnosis not present

## 2023-06-30 DIAGNOSIS — E559 Vitamin D deficiency, unspecified: Secondary | ICD-10-CM | POA: Diagnosis not present

## 2023-06-30 DIAGNOSIS — D696 Thrombocytopenia, unspecified: Secondary | ICD-10-CM | POA: Diagnosis not present

## 2023-07-29 DIAGNOSIS — M1612 Unilateral primary osteoarthritis, left hip: Secondary | ICD-10-CM | POA: Diagnosis not present

## 2023-08-27 DIAGNOSIS — K080 Exfoliation of teeth due to systemic causes: Secondary | ICD-10-CM | POA: Diagnosis not present

## 2023-12-23 DIAGNOSIS — I10 Essential (primary) hypertension: Secondary | ICD-10-CM | POA: Diagnosis not present

## 2023-12-23 DIAGNOSIS — B0229 Other postherpetic nervous system involvement: Secondary | ICD-10-CM | POA: Diagnosis not present

## 2023-12-23 DIAGNOSIS — E119 Type 2 diabetes mellitus without complications: Secondary | ICD-10-CM | POA: Diagnosis not present

## 2024-01-05 DIAGNOSIS — L304 Erythema intertrigo: Secondary | ICD-10-CM | POA: Diagnosis not present

## 2024-01-11 DIAGNOSIS — L304 Erythema intertrigo: Secondary | ICD-10-CM | POA: Diagnosis not present

## 2024-02-05 DIAGNOSIS — Z6827 Body mass index (BMI) 27.0-27.9, adult: Secondary | ICD-10-CM | POA: Diagnosis not present

## 2024-02-05 DIAGNOSIS — E119 Type 2 diabetes mellitus without complications: Secondary | ICD-10-CM | POA: Diagnosis not present

## 2024-02-05 DIAGNOSIS — B372 Candidiasis of skin and nail: Secondary | ICD-10-CM | POA: Diagnosis not present

## 2024-02-19 DIAGNOSIS — Z23 Encounter for immunization: Secondary | ICD-10-CM | POA: Diagnosis not present

## 2024-03-04 DIAGNOSIS — R634 Abnormal weight loss: Secondary | ICD-10-CM | POA: Diagnosis not present

## 2024-03-04 DIAGNOSIS — B379 Candidiasis, unspecified: Secondary | ICD-10-CM | POA: Diagnosis not present

## 2024-03-04 DIAGNOSIS — Z6826 Body mass index (BMI) 26.0-26.9, adult: Secondary | ICD-10-CM | POA: Diagnosis not present

## 2024-03-14 DIAGNOSIS — K080 Exfoliation of teeth due to systemic causes: Secondary | ICD-10-CM | POA: Diagnosis not present

## 2024-03-16 DIAGNOSIS — E119 Type 2 diabetes mellitus without complications: Secondary | ICD-10-CM | POA: Diagnosis not present

## 2024-04-16 DIAGNOSIS — E119 Type 2 diabetes mellitus without complications: Secondary | ICD-10-CM | POA: Diagnosis not present

## 2024-05-10 DIAGNOSIS — Z6825 Body mass index (BMI) 25.0-25.9, adult: Secondary | ICD-10-CM | POA: Diagnosis not present

## 2024-05-10 DIAGNOSIS — L237 Allergic contact dermatitis due to plants, except food: Secondary | ICD-10-CM | POA: Diagnosis not present

## 2024-05-10 DIAGNOSIS — E119 Type 2 diabetes mellitus without complications: Secondary | ICD-10-CM | POA: Diagnosis not present

## 2024-06-01 DIAGNOSIS — H5213 Myopia, bilateral: Secondary | ICD-10-CM | POA: Diagnosis not present
